# Patient Record
Sex: Male | Born: 1970 | Race: Black or African American | Hispanic: No | Marital: Married | Smoking: Never smoker
Health system: Southern US, Community
[De-identification: ages and names within clinical notes are randomized; demographics above are authoritative.]

## PROBLEM LIST (undated history)

## (undated) DIAGNOSIS — I10 Essential (primary) hypertension: Secondary | ICD-10-CM

## (undated) DIAGNOSIS — I6529 Occlusion and stenosis of unspecified carotid artery: Secondary | ICD-10-CM

## (undated) HISTORY — PX: NO PAST SURGERIES: SHX2092

## (undated) HISTORY — DX: Occlusion and stenosis of unspecified carotid artery: I65.29

---

## 2019-03-20 DIAGNOSIS — L988 Other specified disorders of the skin and subcutaneous tissue: Secondary | ICD-10-CM | POA: Diagnosis not present

## 2019-03-23 DIAGNOSIS — L280 Lichen simplex chronicus: Secondary | ICD-10-CM | POA: Diagnosis not present

## 2019-03-27 DIAGNOSIS — L280 Lichen simplex chronicus: Secondary | ICD-10-CM | POA: Diagnosis not present

## 2019-06-23 DIAGNOSIS — I10 Essential (primary) hypertension: Secondary | ICD-10-CM | POA: Diagnosis not present

## 2019-06-23 DIAGNOSIS — E669 Obesity, unspecified: Secondary | ICD-10-CM | POA: Diagnosis not present

## 2019-07-06 DIAGNOSIS — I10 Essential (primary) hypertension: Secondary | ICD-10-CM | POA: Diagnosis not present

## 2019-07-06 DIAGNOSIS — Z23 Encounter for immunization: Secondary | ICD-10-CM | POA: Diagnosis not present

## 2019-08-22 DIAGNOSIS — I10 Essential (primary) hypertension: Secondary | ICD-10-CM | POA: Diagnosis not present

## 2021-10-02 ENCOUNTER — Observation Stay (HOSPITAL_COMMUNITY)
Admission: EM | Admit: 2021-10-02 | Discharge: 2021-10-03 | Disposition: A | Discharge status: Home / Self Care | Payer: 59 | Attending: Family Medicine | Admitting: Family Medicine

## 2021-10-02 ENCOUNTER — Emergency Department (HOSPITAL_COMMUNITY): Payer: 59

## 2021-10-02 DIAGNOSIS — Z79899 Other long term (current) drug therapy: Secondary | ICD-10-CM | POA: Diagnosis not present

## 2021-10-02 DIAGNOSIS — I10 Essential (primary) hypertension: Secondary | ICD-10-CM | POA: Diagnosis not present

## 2021-10-02 DIAGNOSIS — Z20822 Contact with and (suspected) exposure to covid-19: Secondary | ICD-10-CM | POA: Diagnosis not present

## 2021-10-02 DIAGNOSIS — R55 Syncope and collapse: Principal | ICD-10-CM | POA: Diagnosis present

## 2021-10-02 DIAGNOSIS — N179 Acute kidney failure, unspecified: Secondary | ICD-10-CM | POA: Insufficient documentation

## 2021-10-02 DIAGNOSIS — R7989 Other specified abnormal findings of blood chemistry: Secondary | ICD-10-CM | POA: Diagnosis not present

## 2021-10-02 DIAGNOSIS — R778 Other specified abnormalities of plasma proteins: Secondary | ICD-10-CM | POA: Diagnosis present

## 2021-10-02 HISTORY — DX: Essential (primary) hypertension: I10

## 2021-10-02 LAB — CBC WITH DIFFERENTIAL/PLATELET
Abs Immature Granulocytes: 0.02 10*3/uL (ref 0.00–0.07)
Basophils Absolute: 0 10*3/uL (ref 0.0–0.1)
Basophils Relative: 0 %
Eosinophils Absolute: 0.1 10*3/uL (ref 0.0–0.5)
Eosinophils Relative: 1 %
HCT: 41.6 % (ref 39.0–52.0)
Hemoglobin: 13.1 g/dL (ref 13.0–17.0)
Immature Granulocytes: 0 %
Lymphocytes Relative: 21 %
Lymphs Abs: 1.5 10*3/uL (ref 0.7–4.0)
MCH: 25.5 pg — ABNORMAL LOW (ref 26.0–34.0)
MCHC: 31.5 g/dL (ref 30.0–36.0)
MCV: 80.9 fL (ref 80.0–100.0)
Monocytes Absolute: 0.5 10*3/uL (ref 0.1–1.0)
Monocytes Relative: 6 %
Neutro Abs: 5.3 10*3/uL (ref 1.7–7.7)
Neutrophils Relative %: 72 %
Platelets: 238 10*3/uL (ref 150–400)
RBC: 5.14 MIL/uL (ref 4.22–5.81)
RDW: 13 % (ref 11.5–15.5)
WBC: 7.4 10*3/uL (ref 4.0–10.5)
nRBC: 0 % (ref 0.0–0.2)

## 2021-10-02 LAB — COMPREHENSIVE METABOLIC PANEL
ALT: 25 U/L (ref 0–44)
AST: 22 U/L (ref 15–41)
Albumin: 3.9 g/dL (ref 3.5–5.0)
Alkaline Phosphatase: 37 U/L — ABNORMAL LOW (ref 38–126)
Anion gap: 7 (ref 5–15)
BUN: 11 mg/dL (ref 6–20)
CO2: 26 mmol/L (ref 22–32)
Calcium: 8.8 mg/dL — ABNORMAL LOW (ref 8.9–10.3)
Chloride: 105 mmol/L (ref 98–111)
Creatinine, Ser: 1.58 mg/dL — ABNORMAL HIGH (ref 0.61–1.24)
GFR, Estimated: 53 mL/min — ABNORMAL LOW (ref >60)
Glucose, Bld: 114 mg/dL — ABNORMAL HIGH (ref 70–99)
Potassium: 3.5 mmol/L (ref 3.5–5.1)
Sodium: 138 mmol/L (ref 135–145)
Total Bilirubin: 0.9 mg/dL (ref 0.3–1.2)
Total Protein: 6.4 g/dL — ABNORMAL LOW (ref 6.5–8.1)

## 2021-10-02 LAB — CK: Total CK: 207 U/L (ref 49–397)

## 2021-10-02 LAB — TROPONIN I (HIGH SENSITIVITY): Troponin I (High Sensitivity): 12 ng/L (ref <18)

## 2021-10-02 MED ORDER — LACTATED RINGERS IV BOLUS
1000.0000 mL | Freq: Once | INTRAVENOUS | Status: AC
Start: 2021-10-02 — End: 2021-10-02
  Administered 2021-10-02: 1000 mL via INTRAVENOUS

## 2021-10-02 NOTE — ED Triage Notes (Signed)
Passed out at boxing class (about 5sec) and in EMS truck (about 10 sec); elevation on EKG; hx HTN; no CP; no cardiac hx

## 2021-10-02 NOTE — Discharge Instructions (Addendum)
Call the number listed in your discharge paperwork to schedule an appointment with a cardiologist. Avoid strenuous activity or significant exertion until you are able to follow up with the cardiologist. Do not hesitate to return to the emergency department if you experience any chest pain, shortness of breath, or additional fainting spells.

## 2021-10-02 NOTE — ED Provider Notes (Signed)
[facility]   CSN: [id number] Arrival date & time: 10/02/21  1937     History  Chief Complaint  Patient presents with   Loss of Consciousness    Passed out at boxing class and in EMS truck; elevation on EKG    Miguel BadeJamie T Campillo is a 51 y.o. male with past medical history significant for hypertension who presents after a syncopal episode.  Patient was attending his first kickboxing class when he began to feel lightheaded and thirsty.  As he was going to get something to drink, he had a syncopal episode.  He denies chest pain or shortness of breath prior to syncopal episode.  He was able to slumped down into a chair before passing out and did not fall or hit his head.  Afterwards when he was talking to EMS he reportedly had another brief syncopal episode lasting less than 10 seconds.  He was then brought to the emergency department for further evaluation.  He currently feels like his normal self and is asymptomatic.  He has never had anything like this happen before.  He denies any recent infectious symptoms such as fevers, chills, cough, congestion, sore throat, nausea, vomiting, changes to bowel movements or urinary symptoms.    Home Medications Prior to Admission medications   Medication Sig Start Date End Date Taking? Authorizing Provider  amLODipine (NORVASC) 10 MG tablet Take 10 mg by mouth daily. 08/20/21  Yes [provider]      Allergies    Shellfish allergy    Review of Systems   Review of Systems  Constitutional:  Negative for chills and fever.  HENT:  Negative for congestion and sore throat.   Respiratory:  Negative for cough, chest tightness and shortness of breath.   Cardiovascular:  Negative for chest pain and leg swelling.  Gastrointestinal:  Negative for abdominal pain, diarrhea, nausea and vomiting.  Genitourinary:  Negative for dysuria and frequency.  Musculoskeletal:  Negative for back pain and neck  pain.  Skin:  Negative for wound.  Neurological:  Positive for syncope and light-headedness. Negative for dizziness, seizures, weakness and headaches.   Physical Exam Updated Vital Signs BP 136/90    Pulse 100    Temp 98 F (36.7 C) (Oral)    Resp 18    Ht 5\' 10"  (1.778 m)    Wt 102.1 kg    SpO2 99%    BMI 32.28 kg/m  Physical Exam Vitals and nursing note reviewed.  Constitutional:      General: He is not in acute distress.    Appearance: He is well-developed. He is obese.  HENT:     Head: Normocephalic and atraumatic.     Right Ear: External ear normal.     Left Ear: External ear normal.     Nose: Nose normal.     Mouth/Throat:     Mouth: Mucous membranes are moist.     Pharynx: Oropharynx is clear.  Eyes:     Extraocular Movements: Extraocular movements intact.     Conjunctiva/sclera: Conjunctivae normal.     Pupils: Pupils are equal, round, and reactive to light.  Cardiovascular:     Rate and Rhythm: Normal rate and regular rhythm.     Pulses: Normal pulses.     Heart sounds: Normal heart sounds. No murmur heard. Pulmonary:     Effort: Pulmonary effort is normal. No respiratory distress.     Breath sounds: Normal breath sounds.  Abdominal:  Palpations: Abdomen is soft.     Tenderness: There is no abdominal tenderness.  Musculoskeletal:        General: No swelling.     Cervical back: Neck supple.     Right lower leg: No edema.     Left lower leg: No edema.  Skin:    General: Skin is warm and dry.     Capillary Refill: Capillary refill takes less than 2 seconds.  Neurological:     General: No focal deficit present.     Mental Status: He is alert and oriented to person, place, and time.  Psychiatric:        Mood and Affect: Mood normal.    ED Results / Procedures / Treatments   Labs (all labs ordered are listed, but only abnormal results are displayed) Labs Reviewed  CBC WITH DIFFERENTIAL/PLATELET - Abnormal; Notable for the following components:      Result  Value   MCH 25.5 (*)    All other components within normal limits  COMPREHENSIVE METABOLIC PANEL - Abnormal; Notable for the following components:   Glucose, Bld 114 (*)    Creatinine, Ser 1.58 (*)    Calcium 8.8 (*)    Total Protein 6.4 (*)    Alkaline Phosphatase 37 (*)    GFR, Estimated 53 (*)    All other components within normal limits  TROPONIN I (HIGH SENSITIVITY) - Abnormal; Notable for the following components:   Troponin I (High Sensitivity) 23 (*)    All other components within normal limits  CK  TROPONIN I (HIGH SENSITIVITY)    EKG EKG Interpretation  Date/Time:  Thursday October 02 2021 19:45:15 EST Ventricular Rate:  97 PR Interval:  170 QRS Duration: 87 QT Interval:  335 QTC Calculation: 426 R Axis:   48 Text Interpretation: Sinus rhythm Borderline repolarization abnormality T wave abnormality Abnormal ECG Confirmed by Gerhard Munch [phone]) on 10/02/2021 8:02:12 PM  Radiology DG Chest Portable 1 View  Result Date: 10/02/2021 CLINICAL DATA:  Syncope EXAM: PORTABLE CHEST 1 VIEW COMPARISON:  None. FINDINGS: The heart size and mediastinal contours are within normal limits. Both lungs are clear. The visualized skeletal structures are unremarkable. IMPRESSION: No active disease. Electronically Signed   By: Jasmine Pang M.D.   On: 10/02/2021 20:25    Procedures Procedures   Medications Ordered in ED Medications  lactated ringers bolus 1,000 mL (0 mLs Intravenous Stopped 10/02/21 2211)    ED Course/ Medical Decision Making/ A&P                           Patient presents after syncopal episode as described in HPI above.  On initial evaluation, the patient is afebrile, hemodynamically stable, and saturating well on room air no acute distress.  He has no complaints at this time and states that he feels like his normal self.  Will obtain CBC, CMP, troponin x2, CXR, and EKG to further evaluate syncopal episode.  LR bolus ordered for likely dehydration in the setting  of a syncopal episode.  EKG reviewed by myself shows normal sinus rhythm with T wave inversion in lead III.  There is borderline repolarization abnormality in V2.  No significant ST elevations or depressions.  No evidence of arrhythmia.  Intervals and axis are normal.  Labs and imaging reviewed by myself.  CXR with no acute cardiopulmonary abnormality.  CBC is unremarkable.  CMP with elevated creatinine to 1.58.  I have no prior for  comparison.  Patient was empirically given a liter bolus of LR for dehydration.  Initial troponin is normal.  Delta troponin mildly elevated at 23.  Given elevated troponin and unclear etiology of syncope, patient is medium risk based on [place] syncope risk score.  I believe it would be appropriate to bring the patient into the hospital for echocardiogram and to continue trending troponins.  The patient continues to deny all symptoms including chest pain.  I discussed the patient with the hospitalist who will admit the patient.   Final Clinical Impression(s) / ED Diagnoses Final diagnoses:  Syncope, unspecified syncope type    Rx / DC Orders ED Discharge Orders     None         Ronald Vinsant, Davy Pique, MD 10/03/21 0050    Gerhard Munch, MD 10/08/21 [phone]

## 2021-10-02 NOTE — ED Notes (Signed)
Pt ambulated to bathroom with no noted difficulties.

## 2021-10-03 ENCOUNTER — Observation Stay (HOSPITAL_BASED_OUTPATIENT_CLINIC_OR_DEPARTMENT_OTHER): Payer: 59

## 2021-10-03 ENCOUNTER — Encounter (HOSPITAL_COMMUNITY): Payer: Self-pay

## 2021-10-03 ENCOUNTER — Other Ambulatory Visit: Payer: Self-pay

## 2021-10-03 DIAGNOSIS — I10 Essential (primary) hypertension: Secondary | ICD-10-CM | POA: Diagnosis not present

## 2021-10-03 DIAGNOSIS — N179 Acute kidney failure, unspecified: Secondary | ICD-10-CM | POA: Diagnosis not present

## 2021-10-03 DIAGNOSIS — R55 Syncope and collapse: Secondary | ICD-10-CM

## 2021-10-03 DIAGNOSIS — R778 Other specified abnormalities of plasma proteins: Secondary | ICD-10-CM | POA: Diagnosis not present

## 2021-10-03 LAB — CBC
HCT: 40.7 % (ref 39.0–52.0)
Hemoglobin: 12.7 g/dL — ABNORMAL LOW (ref 13.0–17.0)
MCH: 25.2 pg — ABNORMAL LOW (ref 26.0–34.0)
MCHC: 31.2 g/dL (ref 30.0–36.0)
MCV: 80.9 fL (ref 80.0–100.0)
Platelets: 237 10*3/uL (ref 150–400)
RBC: 5.03 MIL/uL (ref 4.22–5.81)
RDW: 13.3 % (ref 11.5–15.5)
WBC: 8.7 10*3/uL (ref 4.0–10.5)
nRBC: 0 % (ref 0.0–0.2)

## 2021-10-03 LAB — COMPREHENSIVE METABOLIC PANEL
ALT: 26 U/L (ref 0–44)
AST: 22 U/L (ref 15–41)
Albumin: 3.6 g/dL (ref 3.5–5.0)
Alkaline Phosphatase: 36 U/L — ABNORMAL LOW (ref 38–126)
Anion gap: 6 (ref 5–15)
BUN: 10 mg/dL (ref 6–20)
CO2: 23 mmol/L (ref 22–32)
Calcium: 8.5 mg/dL — ABNORMAL LOW (ref 8.9–10.3)
Chloride: 110 mmol/L (ref 98–111)
Creatinine, Ser: 1.25 mg/dL — ABNORMAL HIGH (ref 0.61–1.24)
GFR, Estimated: >60 mL/min (ref >60)
Glucose, Bld: 97 mg/dL (ref 70–99)
Potassium: 3.9 mmol/L (ref 3.5–5.1)
Sodium: 139 mmol/L (ref 135–145)
Total Bilirubin: 0.8 mg/dL (ref 0.3–1.2)
Total Protein: 6 g/dL — ABNORMAL LOW (ref 6.5–8.1)

## 2021-10-03 LAB — RESP PANEL BY RT-PCR (FLU A&B, COVID) ARPGX2
Influenza A by PCR: NEGATIVE
Influenza B by PCR: NEGATIVE
SARS Coronavirus 2 by RT PCR: NEGATIVE

## 2021-10-03 LAB — ECHOCARDIOGRAM COMPLETE
AR max vel: 2.43 cm2
AV Peak grad: 11.3 mmHg
Ao pk vel: 1.68 m/s
Area-P 1/2: 5.79 cm2
Calc EF: 58 %
Height: 70 in
S' Lateral: 2.8 cm
Single Plane A2C EF: 59.7 %
Single Plane A4C EF: 58.2 %
Weight: 3600 oz

## 2021-10-03 LAB — TROPONIN I (HIGH SENSITIVITY)
Troponin I (High Sensitivity): 17 ng/L (ref <18)
Troponin I (High Sensitivity): 23 ng/L — ABNORMAL HIGH (ref <18)

## 2021-10-03 LAB — URINALYSIS, COMPLETE (UACMP) WITH MICROSCOPIC
Bilirubin Urine: NEGATIVE
Glucose, UA: NEGATIVE mg/dL
Hgb urine dipstick: NEGATIVE
Ketones, ur: NEGATIVE mg/dL
Leukocytes,Ua: NEGATIVE
Nitrite: NEGATIVE
Protein, ur: NEGATIVE mg/dL
Specific Gravity, Urine: 1.015 (ref 1.005–1.030)
pH: 6.5 (ref 5.0–8.0)

## 2021-10-03 LAB — SODIUM, URINE, RANDOM: Sodium, Ur: 147 mmol/L

## 2021-10-03 LAB — CREATININE, URINE, RANDOM: Creatinine, Urine: 171.24 mg/dL

## 2021-10-03 LAB — HIV ANTIBODY (ROUTINE TESTING W REFLEX): HIV Screen 4th Generation wRfx: NONREACTIVE

## 2021-10-03 LAB — MAGNESIUM: Magnesium: 2.2 mg/dL (ref 1.7–2.4)

## 2021-10-03 MED ORDER — SODIUM CHLORIDE 0.9 % IV SOLN: INTRAVENOUS | Status: DC

## 2021-10-03 MED ORDER — ACETAMINOPHEN 650 MG RE SUPP: 650.0000 mg | Freq: Four times a day (QID) | RECTAL | Status: DC | PRN

## 2021-10-03 MED ORDER — ACETAMINOPHEN 325 MG PO TABS: 650.0000 mg | ORAL_TABLET | Freq: Four times a day (QID) | ORAL | Status: DC | PRN

## 2021-10-03 MED ORDER — LACTATED RINGERS IV SOLN: INTRAVENOUS | Status: DC

## 2021-10-03 NOTE — Discharge Summary (Signed)
PatientPhysician Discharge Summary  DEMETRIS WHITESELL [id number] DOB: 23-Sep-1970 DOA: 10/02/2021  PCP: Default, Provider, MD  Admit date: 10/02/2021 Discharge date: 10/03/2021    Admitted From: Home Disposition: Home  Recommendations for Outpatient Follow-up:  Follow up with PCP in 1-2 weeks Please obtain BMP/CBC in one week Please follow up with your PCP on the following pending results: Unresulted Labs (From admission, onward)    None         Home Health: None Equipment/Devices: None  Discharge Condition: Stable CODE STATUS: Full code Diet recommendation: Cardiac  Subjective: Seen and examined.  He has no complaints.  Fully alert and oriented.  No focal deficit.  No further episodes of syncope since admission.  I have copied following HPI and ED course from my colleague admitting hospitalist Dr. Nadara Mustard towards H&P for details the interested readers. HPI: JALONTAE BOLAN is a 51 y.o. male with medical history significant for essential hypertension who is admitted to [facility] on 10/02/2021 with syncope after presenting from home to [facility] ED complaining of 2 episodes of loss of consciousness.    The patient presents with complaint of 2 episodes of loss of consciousness over the last day.  The for such episode occurred while he was at a kickboxing class earlier on 10/02/2021.  He reports that he had a significant thirst and was actively ambulating to the water fountain to get a drink, when he developed dizziness, lightheadedness, and the subjective sensation of impending loss of consciousness.  In light of these preceding symptoms, he was able to slowly lower himself to a surrounding chair before completely losing consciousness.  As a consequence, there was no associated trauma, loss of consciousness, or hitting of his head.  This was a witnessed episode, with observers subsequently conveying that the loss of consciousness lasted for less than 10 seconds and was not associated  with any tonic-clonic activity.  Additionally, this episode was not associate with any loss of bowel/bladder function, nor any tongue biting.     EMS was subsequently contacted, and the patient was brought to [facility] emergency department for further evaluation thereof.  In route to ED, the patient experienced a second episode of loss of consciousness, associated with very similar preceding symptoms of dizziness, lightheadedness, and subjective sensation of impending consciousness, prior to overt loss of consciousness, as witnessed by EMS personnel, confirming that the duration of this loss consciousness lasted for less than 10 seconds, without any ensuing confusion upon regaining consciousness.   As a relates to both these episodes of syncope, the patient denies any recent, immediately preceding, or ensuing chest pain, shortness of breath, palpitations, nausea, vomiting,.  He also denies any prior history of syncopal event.   The patient denies any associated acute focal weakness, acute focal numbness, paresthesias, facial droop, slurred speech, expressive aphasia, acute change in vision, dysphagia, vertigo.  Denies any associated or ensuing headache or neck pain. Denies any additional resultant acute arthralgias or myalgias.    Denies any recent subjective fever, chills, rigors, or generalized myalgias.  No recent neck stiffness, rhinitis, rhinorrhea, sore throat, wheezing, cough, abdominal pain, or rash.  No recent traveling or known COVID-19 exposures. No recent worsening of peripheral edema, nor any calf tenderness, or new lower extremity erythema.  Denies any recent hemoptysis.  Not associated with any recent dysuria, gross hematuria, or change in urinary urgency/frequency.   Medical history notable for essential hypertension, for which the patient is on amlodipine.  No prior echocardiogram on file.  ED Course:  Vital signs in the ED were notable for the following:  Afebrile; heart  rate 94-1 02; blood pressure 129/83-140/88; respiratory rate 16-21, oxygen saturation 99 to 100% on room air.   Labs were notable for the following: CMP notable for the following: Sodium 138, potassium 3.5, creatinine 1.58, without any prior serum creatinine data points available for point comparison of glucose 114, and liver enzymes around his MRI limits.  High-sensitivity troponin initially noted to be 12 with repeat value trending slightly 23, without any prior troponin value available for point comparison.  CBC notable for white blood cell count 7400, he 13.  COVID-19/influenza PCR were checked in the ED today, with results currently pending.   Imaging and additional notable ED work-up: EKG showed sinus rhythm with heart rate 97, normal intervals, nonspecific T wave inversion in lead III, as well as nonspecific less than 1 mm ST elevation limited to V2, without any prior EKG available for point of comparison.  Chest x-ray showed no evidence of acute cardiopulmonary process, including no evidence of infiltrate, edema, effusion, or pneumothorax.   While in the ED, the following were administered: LR x1 L bolus.  Subsequently, the patient was admitted for overnight observation for further evaluation and management of presenting syncope.    Brief/Interim Summary: Briefly, he was admitted with syncope.  EKG with no acute ST-T wave changes or any arrhythmia.  First troponin negative, second slightly limited and third negative.  Transthoracic echo with normal ejection fraction, no wall motion abnormality no right heart failure.  Tricuspid aortic valve with no stenosis or regurgitation.  No other valvular defect either.  Patient takes amlodipine which have been held and despite of that, patient's blood pressure is either normal or low normal and the fact that patient also came in with AKI which is improving with IV fluids indicates that patient's syncope is likely combination of orthostatic hypotension,  dehydration and overtreatment of his blood pressure.  Based on the data, I am discontinuing his amlodipine at discharge and have recommended him to keep himself hydrated.  Check your blood pressure regularly and take the recorded data to your PCPs visit next week for them to reconsider starting antihypertensives if needed.  Patient tells me that he works out on a regular basis.  Unfortunately, I do not have information available whether his blood pressure was checked at the scene by EMS or not.  Orthostatics were ordered yesterday here but they were not checked and patient has received significant amount of IV fluids and orthostatics will be skewed at this point in time.  Discharge plan was discussed with patient and/or family member and they verbalized understanding and agreed with it.  Discharge Diagnoses:  Principal Problem:   Syncope Active Problems:   AKI (acute kidney injury) ([place])   Elevated troponin   HTN (hypertension)    Discharge Instructions   Allergies as of 10/03/2021       Reactions   Shellfish Allergy Anaphylaxis        Medication List     STOP taking these medications    amLODipine 10 MG tablet Commonly known as: NORVASC        Allergies  Allergen Reactions   Shellfish Allergy Anaphylaxis    Consultations: None   Procedures/Studies: DG Chest Portable 1 View  Result Date: 10/02/2021 CLINICAL DATA:  Syncope EXAM: PORTABLE CHEST 1 VIEW COMPARISON:  None. FINDINGS: The heart size and mediastinal contours are within normal limits. Both lungs are clear. The visualized  skeletal structures are unremarkable. IMPRESSION: No active disease. Electronically Signed   By: Donavan Foil M.D.   On: 10/02/2021 20:25   ECHOCARDIOGRAM COMPLETE  Result Date: 10/03/2021    ECHOCARDIOGRAM REPORT   Patient Name:   MCCAIN BUCHBERGER Date of Exam: 10/03/2021 Medical Rec #:  [id number]       Height:       70.0 in Accession #:    [id number]      Weight:       225.0 lb Date of  Birth:  1970/10/10        BSA:          2.194 m Patient Age:    51 years        BP:           116/74 mmHg Patient Gender: M               HR:           87 bpm. Exam Location:  Inpatient Procedure: 2D Echo, Color Doppler and Cardiac Doppler Indications:    Syncope  History:        Patient has no prior history of Echocardiogram examinations.                 Risk Factors:Hypertension.  Sonographer:    Jyl Heinz Referring Phys: [id number] [place]  1. Left ventricular ejection fraction, by estimation, is 60 to 65%. The left ventricle has normal function. The left ventricle has no regional wall motion abnormalities. Left ventricular diastolic parameters were normal.  2. Right ventricular systolic function is normal. The right ventricular size is normal. Tricuspid regurgitation signal is inadequate for assessing PA pressure.  3. Left atrial size was mildly dilated.  4. The mitral valve is normal in structure. Mild mitral valve regurgitation.  5. The aortic valve is tricuspid. There is mild calcification of the aortic valve. There is mild thickening of the aortic valve. Aortic valve regurgitation is not visualized. Aortic valve sclerosis is present, with no evidence of aortic valve stenosis.  6. The inferior vena cava is normal in size with greater than 50% respiratory variability, suggesting right atrial pressure of 3 mmHg. Comparison(s): No prior Echocardiogram. FINDINGS  Left Ventricle: Left ventricular ejection fraction, by estimation, is 60 to 65%. The left ventricle has normal function. The left ventricle has no regional wall motion abnormalities. The left ventricular internal cavity size was normal in size. There is  no left ventricular hypertrophy. Left ventricular diastolic parameters were normal. Right Ventricle: The right ventricular size is normal. No increase in right ventricular wall thickness. Right ventricular systolic function is normal. Tricuspid regurgitation signal is inadequate for  assessing PA pressure. Left Atrium: Left atrial size was mildly dilated. Right Atrium: Right atrial size was normal in size. Pericardium: There is no evidence of pericardial effusion. Mitral Valve: The mitral valve is normal in structure. Mild mitral valve regurgitation. Tricuspid Valve: The tricuspid valve is normal in structure. Tricuspid valve regurgitation is trivial. Aortic Valve: The aortic valve is tricuspid. There is mild calcification of the aortic valve. There is mild thickening of the aortic valve. Aortic valve regurgitation is not visualized. Aortic valve sclerosis is present, with no evidence of aortic valve stenosis. Aortic valve peak gradient measures 11.3 mmHg. Pulmonic Valve: The pulmonic valve was normal in structure. Pulmonic valve regurgitation is trivial. Aorta: The aortic root and ascending aorta are structurally normal, with no evidence of dilitation. Venous: The inferior vena cava is normal  in size with greater than 50% respiratory variability, suggesting right atrial pressure of 3 mmHg. IAS/Shunts: The atrial septum is grossly normal.  LEFT VENTRICLE PLAX 2D LVIDd:         4.40 cm      Diastology LVIDs:         2.80 cm      LV e' medial:    8.05 cm/s LV PW:         1.20 cm      LV E/e' medial:  12.0 LV IVS:        1.00 cm      LV e' lateral:   9.57 cm/s LVOT diam:     2.00 cm      LV E/e' lateral: 10.1 LV SV:         84 LV SV Index:   38 LVOT Area:     3.14 cm  LV Volumes (MOD) LV vol d, MOD A2C: 125.0 ml LV vol d, MOD A4C: 131.0 ml LV vol s, MOD A2C: 50.4 ml LV vol s, MOD A4C: 54.8 ml LV SV MOD A2C:     74.6 ml LV SV MOD A4C:     131.0 ml LV SV MOD BP:      74.2 ml RIGHT VENTRICLE             IVC RV Basal diam:  3.10 cm     IVC diam: 1.30 cm RV Mid diam:    2.40 cm RV S prime:     12.90 cm/s TAPSE (M-mode): 2.5 cm LEFT ATRIUM             Index        RIGHT ATRIUM           Index LA diam:        3.30 cm 1.50 cm/m   RA Area:     12.60 cm LA Vol (A2C):   47.3 ml 21.55 ml/m  RA Volume:    27.10 ml  12.35 ml/m LA Vol (A4C):   76.1 ml 34.68 ml/m LA Biplane Vol: 62.1 ml 28.30 ml/m  AORTIC VALVE AV Area (Vmax): 2.43 cm AV Vmax:        168.00 cm/s AV Peak Grad:   11.3 mmHg LVOT Vmax:      130.00 cm/s LVOT Vmean:     94.100 cm/s LVOT VTI:       0.267 m  AORTA Ao Root diam: 2.70 cm Ao Asc diam:  2.80 cm MITRAL VALVE MV Area (PHT): 5.79 cm    SHUNTS MV Decel Time: 131 msec    Systemic VTI:  0.27 m MV E velocity: 96.70 cm/s  Systemic Diam: 2.00 cm MV A velocity: 87.30 cm/s MV E/A ratio:  1.11 Gwyndolyn Kaufman MD Electronically signed by Gwyndolyn Kaufman MD Signature Date/Time: 10/03/2021/10:49:48 AM    Final      Discharge Exam: Vitals:   10/03/21 0630 10/03/21 0730  BP: 116/74 122/79  Pulse: 84 87  Resp: 16 16  Temp:  98 F (36.7 C)  SpO2: 97% 98%   Vitals:   10/03/21 0600 10/03/21 0615 10/03/21 0630 10/03/21 0730  BP: 127/82 119/62 116/74 122/79  Pulse: 88 86 84 87  Resp: 17 16 16 16   Temp:    98 F (36.7 C)  TempSrc:    Oral  SpO2: 99% 98% 97% 98%  Weight:      Height:        General: Pt is alert, awake, not in  acute distress Cardiovascular: RRR, S1/S2 +, no rubs, no gallops Respiratory: CTA bilaterally, no wheezing, no rhonchi Abdominal: Soft, NT, ND, bowel sounds + Extremities: no edema, no cyanosis    The results of significant diagnostics from this hospitalization (including imaging, microbiology, ancillary and laboratory) are listed below for reference.     Microbiology: Recent Results (from the past 240 hour(s))  Resp Panel by RT-PCR (Flu A&B, Covid) Nasopharyngeal Swab     Status: None   Collection Time: 10/03/21 12:52 AM   Specimen: Nasopharyngeal Swab; Nasopharyngeal(NP) swabs in vial transport medium  Result Value Ref Range Status   SARS Coronavirus 2 by RT PCR NEGATIVE NEGATIVE Final    Comment: (NOTE) SARS-CoV-2 target nucleic acids are NOT DETECTED.  The SARS-CoV-2 RNA is generally detectable in upper respiratory specimens during the acute  phase of infection. The lowest concentration of SARS-CoV-2 viral copies this assay can detect is 138 copies/mL. A negative result does not preclude SARS-Cov-2 infection and should not be used as the sole basis for treatment or other patient management decisions. A negative result may occur with  improper specimen collection/handling, submission of specimen other than nasopharyngeal swab, presence of viral mutation(s) within the areas targeted by this assay, and inadequate number of viral copies(<138 copies/mL). A negative result must be combined with clinical observations, patient history, and epidemiological information. The expected result is Negative.  Fact Sheet for Patients:  EntrepreneurPulse.com.au  Fact Sheet for Healthcare Providers:  IncredibleEmployment.be  This test is no t yet approved or cleared by the [place] FDA and  has been authorized for detection and/or diagnosis of SARS-CoV-2 by FDA under an Emergency Use Authorization (EUA). This EUA will remain  in effect (meaning this test can be used) for the duration of the COVID-19 declaration under Section 564(b)(1) of the Act, 21 U.S.C.section 360bbb-3(b)(1), unless the authorization is terminated  or revoked sooner.       Influenza A by PCR NEGATIVE NEGATIVE Final   Influenza B by PCR NEGATIVE NEGATIVE Final    Comment: (NOTE) The Xpert Xpress SARS-CoV-2/FLU/RSV plus assay is intended as an aid in the diagnosis of influenza from Nasopharyngeal swab specimens and should not be used as a sole basis for treatment. Nasal washings and aspirates are unacceptable for Xpert Xpress SARS-CoV-2/FLU/RSV testing.  Fact Sheet for Patients: EntrepreneurPulse.com.au  Fact Sheet for Healthcare Providers: IncredibleEmployment.be  This test is not yet approved or cleared by the [place] FDA and has been authorized for detection and/or diagnosis of  SARS-CoV-2 by FDA under an Emergency Use Authorization (EUA). This EUA will remain in effect (meaning this test can be used) for the duration of the COVID-19 declaration under Section 564(b)(1) of the Act, 21 U.S.C. section 360bbb-3(b)(1), unless the authorization is terminated or revoked.  Performed at [facility], [place] [address] [place], [place] [address]      Labs: BNP (last 3 results) No results for input(s): BNP in the last 8760 hours. Basic Metabolic Panel: Recent Labs  Lab 10/02/21 2010 10/03/21 0342  NA 138 139  K 3.5 3.9  CL 105 110  CO2 26 23  GLUCOSE 114* 97  BUN 11 10  CREATININE 1.58* 1.25*  CALCIUM 8.8* 8.5*  MG  --  2.2   Liver Function Tests: Recent Labs  Lab 10/02/21 2010 10/03/21 0342  AST 22 22  ALT 25 26  ALKPHOS 37* 36*  BILITOT 0.9 0.8  PROT 6.4* 6.0*  ALBUMIN 3.9 3.6   No results for input(s):  LIPASE, AMYLASE in the last 168 hours. No results for input(s): AMMONIA in the last 168 hours. CBC: Recent Labs  Lab 10/02/21 2010 10/03/21 0342  WBC 7.4 8.7  NEUTROABS 5.3  --   HGB 13.1 12.7*  HCT 41.6 40.7  MCV 80.9 80.9  PLT 238 237   Cardiac Enzymes: Recent Labs  Lab 10/02/21 2010  CKTOTAL 207   BNP: Invalid input(s): POCBNP CBG: No results for input(s): GLUCAP in the last 168 hours. D-Dimer No results for input(s): DDIMER in the last 72 hours. Hgb A1c No results for input(s): HGBA1C in the last 72 hours. Lipid Profile No results for input(s): CHOL, HDL, LDLCALC, TRIG, CHOLHDL, LDLDIRECT in the last 72 hours. Thyroid function studies No results for input(s): TSH, T4TOTAL, T3FREE, THYROIDAB in the last 72 hours.  Invalid input(s): FREET3 Anemia work up No results for input(s): VITAMINB12, FOLATE, FERRITIN, TIBC, IRON, RETICCTPCT in the last 72 hours. Urinalysis    Component Value Date/Time   COLORURINE YELLOW 10/03/2021 0354   APPEARANCEUR CLEAR 10/03/2021 0354   LABSPEC 1.015 10/03/2021 0354   PHURINE 6.5  10/03/2021 0354   GLUCOSEU NEGATIVE 10/03/2021 0354   HGBUR NEGATIVE 10/03/2021 0354   BILIRUBINUR NEGATIVE 10/03/2021 0354   KETONESUR NEGATIVE 10/03/2021 0354   PROTEINUR NEGATIVE 10/03/2021 0354   NITRITE NEGATIVE 10/03/2021 0354   LEUKOCYTESUR NEGATIVE 10/03/2021 0354   Sepsis Labs Invalid input(s): PROCALCITONIN,  WBC,  LACTICIDVEN Microbiology Recent Results (from the past 240 hour(s))  Resp Panel by RT-PCR (Flu A&B, Covid) Nasopharyngeal Swab     Status: None   Collection Time: 10/03/21 12:52 AM   Specimen: Nasopharyngeal Swab; Nasopharyngeal(NP) swabs in vial transport medium  Result Value Ref Range Status   SARS Coronavirus 2 by RT PCR NEGATIVE NEGATIVE Final    Comment: (NOTE) SARS-CoV-2 target nucleic acids are NOT DETECTED.  The SARS-CoV-2 RNA is generally detectable in upper respiratory specimens during the acute phase of infection. The lowest concentration of SARS-CoV-2 viral copies this assay can detect is 138 copies/mL. A negative result does not preclude SARS-Cov-2 infection and should not be used as the sole basis for treatment or other patient management decisions. A negative result may occur with  improper specimen collection/handling, submission of specimen other than nasopharyngeal swab, presence of viral mutation(s) within the areas targeted by this assay, and inadequate number of viral copies(<138 copies/mL). A negative result must be combined with clinical observations, patient history, and epidemiological information. The expected result is Negative.  Fact Sheet for Patients:  BloggerCourse.com  Fact Sheet for Healthcare Providers:  SeriousBroker.it  This test is no t yet approved or cleared by the [place] FDA and  has been authorized for detection and/or diagnosis of SARS-CoV-2 by FDA under an Emergency Use Authorization (EUA). This EUA will remain  in effect (meaning this test can be used)  for the duration of the COVID-19 declaration under Section 564(b)(1) of the Act, 21 U.S.C.section 360bbb-3(b)(1), unless the authorization is terminated  or revoked sooner.       Influenza A by PCR NEGATIVE NEGATIVE Final   Influenza B by PCR NEGATIVE NEGATIVE Final    Comment: (NOTE) The Xpert Xpress SARS-CoV-2/FLU/RSV plus assay is intended as an aid in the diagnosis of influenza from Nasopharyngeal swab specimens and should not be used as a sole basis for treatment. Nasal washings and aspirates are unacceptable for Xpert Xpress SARS-CoV-2/FLU/RSV testing.  Fact Sheet for Patients: BloggerCourse.com  Fact Sheet for Healthcare Providers: SeriousBroker.it  This test is not  yet approved or cleared by the [place] and has been authorized for detection and/or diagnosis of SARS-CoV-2 by FDA under an Emergency Use Authorization (EUA). This EUA will remain in effect (meaning this test can be used) for the duration of the COVID-19 declaration under Section 564(b)(1) of the Act, 21 U.S.C. section 360bbb-3(b)(1), unless the authorization is terminated or revoked.  Performed at [facility], [place] [address] [place], [place] [address]      Time coordinating discharge: Over 30 minutes  SIGNED:   Darliss Cheney, MD  [facility] 10/03/2021, 12:24 PM  If 7PM-7AM, please contact night-coverage [email or web address]

## 2021-10-03 NOTE — ED Notes (Signed)
LR unvailable for infusion. Normal saline hung at 96ml/hr

## 2021-10-03 NOTE — H&P (Signed)
History and Physical    PLEASE NOTE THAT DRAGON DICTATION SOFTWARE WAS USED IN THE CONSTRUCTION OF THIS NOTE.   Miguel Marks [id number] DOB: 06/05/1971 DOA: 10/02/2021  PCP: Default, Provider, MD  Patient coming from: home   I have personally briefly reviewed patient's old medical records in [place]  Chief Complaint: loss of consciousness  HPI: Miguel Marks is a 51 y.o. male with medical history significant for essential hypertension who is admitted to [facility] on 10/02/2021 with syncope after presenting from home to [facility] ED complaining of 2 episodes of loss of consciousness.   The patient presents with complaint of 2 episodes of loss of consciousness over the last day.  The for such episode occurred while he was at a kickboxing class earlier on 10/02/2021.  He reports that he had a significant thirst and was actively ambulating to the water fountain to get a drink, when he developed dizziness, lightheadedness, and the subjective sensation of impending loss of consciousness.  In light of these preceding symptoms, he was able to slowly lower himself to a surrounding chair before completely losing consciousness.  As a consequence, there was no associated trauma, loss of consciousness, or hitting of his head.  This was a witnessed episode, with observers subsequently conveying that the loss of consciousness lasted for less than 10 seconds and was not associated with any tonic-clonic activity.  Additionally, this episode was not associate with any loss of bowel/bladder function, nor any tongue biting.    EMS was subsequently contacted, and the patient was brought to [facility] emergency department for further evaluation thereof.  In route to ED, the patient experienced a second episode of loss of consciousness, associated with very similar preceding symptoms of dizziness, lightheadedness, and subjective sensation of impending consciousness, prior to overt loss of consciousness,  as witnessed by EMS personnel, confirming that the duration of this loss consciousness lasted for less than 10 seconds, without any ensuing confusion upon regaining consciousness.  As a relates to both these episodes of syncope, the patient denies any recent, immediately preceding, or ensuing chest pain, shortness of breath, palpitations, nausea, vomiting,.  He also denies any prior history of syncopal event.  The patient denies any associated acute focal weakness, acute focal numbness, paresthesias, facial droop, slurred speech, expressive aphasia, acute change in vision, dysphagia, vertigo.  Denies any associated or ensuing headache or neck pain. Denies any additional resultant acute arthralgias or myalgias.   Denies any recent subjective fever, chills, rigors, or generalized myalgias.  No recent neck stiffness, rhinitis, rhinorrhea, sore throat, wheezing, cough, abdominal pain, or rash.  No recent traveling or known COVID-19 exposures. No recent worsening of peripheral edema, nor any calf tenderness, or new lower extremity erythema.  Denies any recent hemoptysis.  Not associated with any recent dysuria, gross hematuria, or change in urinary urgency/frequency.   Medical history notable for essential hypertension, for which the patient is on amlodipine.  No prior echocardiogram on file.        ED Course:  Vital signs in the ED were notable for the following:  Afebrile; heart rate 94-1 02; blood pressure 129/83-140/88; respiratory rate 16-21, oxygen saturation 99 to 100% on room air.  Labs were notable for the following: CMP notable for the following: Sodium 138, potassium 3.5, creatinine 1.58, without any prior serum creatinine data points available for point comparison of glucose 114, and liver enzymes around his MRI limits.  High-sensitivity troponin initially noted to be 12 with  repeat value trending slightly 23, without any prior troponin value available for point comparison.  CBC notable for  white blood cell count 7400, he 13.  COVID-19/influenza PCR were checked in the ED today, with results currently pending.  Imaging and additional notable ED work-up: EKG showed sinus rhythm with heart rate 97, normal intervals, nonspecific T wave inversion in lead III, as well as nonspecific less than 1 mm ST elevation limited to V2, without any prior EKG available for point of comparison.  Chest x-ray showed no evidence of acute cardiopulmonary process, including no evidence of infiltrate, edema, effusion, or pneumothorax.  While in the ED, the following were administered: LR x1 L bolus.  Subsequently, the patient was admitted for overnight observation for further evaluation and management of presenting syncope.   Review of Systems: As per HPI otherwise 10 point review of systems negative.   Past Medical History:  Diagnosis Date   HTN (hypertension)     History reviewed. No pertinent surgical history.  Social History:  reports that he has never smoked. He has never used smokeless tobacco. He reports that he does not currently use alcohol. He reports that he does not use drugs.   Allergies  Allergen Reactions   Shellfish Allergy Anaphylaxis    History reviewed. No pertinent family history.   Prior to Admission medications   Medication Sig Start Date End Date Taking? Authorizing Provider  amLODipine (NORVASC) 10 MG tablet Take 10 mg by mouth daily. 08/20/21  Yes [provider]     Objective    Physical Exam: Vitals:   10/02/21 2015 10/02/21 2030 10/02/21 2045 10/02/21 2300  BP: 137/87   136/90  Pulse: 95 99 94 100  Resp: 19 16 (!) 21 18  Temp:      TempSrc:      SpO2: 100% 99% 100% 99%  Weight:      Height:        General: appears to be stated age; alert, oriented Skin: warm, dry, no rash Head:  AT/[place] Mouth:  Oral mucosa membranes appear dry, normal dentition Neck: supple; trachea midline Heart:  RRR; did not appreciate any M/R/G Lungs: CTAB, did not  appreciate any wheezes, rales, or rhonchi Abdomen: + BS; soft, ND, NT Vascular: 2+ pedal pulses b/l; 2+ radial pulses b/l Extremities: no peripheral edema, no muscle wasting Neuro: strength and sensation intact in upper and lower extremities b/l   Labs on Admission: I have personally reviewed following labs and imaging studies  CBC: Recent Labs  Lab 10/02/21 2010  WBC 7.4  NEUTROABS 5.3  HGB 13.1  HCT 41.6  MCV 80.9  PLT 99991111   Basic Metabolic Panel: Recent Labs  Lab 10/02/21 2010  NA 138  K 3.5  CL 105  CO2 26  GLUCOSE 114*  BUN 11  CREATININE 1.58*  CALCIUM 8.8*   GFR: Estimated Creatinine Clearance: 66.2 mL/min (A) (by C-G formula based on SCr of 1.58 mg/dL (H)). Liver Function Tests: Recent Labs  Lab 10/02/21 2010  AST 22  ALT 25  ALKPHOS 37*  BILITOT 0.9  PROT 6.4*  ALBUMIN 3.9   No results for input(s): LIPASE, AMYLASE in the last 168 hours. No results for input(s): AMMONIA in the last 168 hours. Coagulation Profile: No results for input(s): INR, PROTIME in the last 168 hours. Cardiac Enzymes: Recent Labs  Lab 10/02/21 2010  CKTOTAL 207   BNP (last 3 results) No results for input(s): PROBNP in the last 8760 hours. HbA1C: No results  for input(s): HGBA1C in the last 72 hours. CBG: No results for input(s): GLUCAP in the last 168 hours. Lipid Profile: No results for input(s): CHOL, HDL, LDLCALC, TRIG, CHOLHDL, LDLDIRECT in the last 72 hours. Thyroid Function Tests: No results for input(s): TSH, T4TOTAL, FREET4, T3FREE, THYROIDAB in the last 72 hours. Anemia Panel: No results for input(s): VITAMINB12, FOLATE, FERRITIN, TIBC, IRON, RETICCTPCT in the last 72 hours. Urine analysis: No results found for: COLORURINE, APPEARANCEUR, LABSPEC, [place], GLUCOSEU, HGBUR, BILIRUBINUR, KETONESUR, PROTEINUR, UROBILINOGEN, NITRITE, LEUKOCYTESUR  Radiological Exams on Admission: DG Chest Portable 1 View  Result Date: 10/02/2021 CLINICAL DATA:  Syncope EXAM:  PORTABLE CHEST 1 VIEW COMPARISON:  None. FINDINGS: The heart size and mediastinal contours are within normal limits. Both lungs are clear. The visualized skeletal structures are unremarkable. IMPRESSION: No active disease. Electronically Signed   By: Donavan Foil M.D.   On: 10/02/2021 20:25     EKG: Independently reviewed, with result as described above.    Assessment/Plan   Principal Problem:   Syncope Active Problems:   AKI (acute kidney injury) (HCC)   Elevated troponin   HTN (hypertension)     #) Syncope: 2 episode of syncope of the last day that were associated with presyncope, with differential including orthostatic hypotension in the setting of dehydration given that the patient was working out at a kickboxing class immediately preceding the first such episode, and complaining of significant thirst in the minutes leading up to the first episode of syncope, with potential exacerbation by home pharmacologic factors serving to diminish compensatory peripheral vasoconstrictive response in the setting of outpatient amlodipine. Will check orthostatic vital signs, but with the caveat that the patient has already received IVF's in the ED, potentially altering the results of this evaluation.    Not associated with any overt acute focal neurologic deficits. Clinically, acute ischemic stroke versus seizures appear less likely at this time. Presentation appears less consistent with ACS at this time, with presenting EKG showing no evidence of acute ischemic changes, and in the absence of any associated CP.  However, we will plan to continue to trend serial troponin and pursue echocardiogram in the morning, while closely monitoring on telemetry.  In setting of associated prodrome, the possibility of ventricular arrhythmia appears to be less likely at this time, although will closely monitor on telemetry for any e/o potential contributory arrhythmia, while also assessing serum Mg level.  Differential  also includes neurocardiogenic syncope, particularly given increased propensity for incurring pain given that he was incurring trauma via kickboxing.     Plan: I have placed a nursing communication order requesting that orthostatic vital signs x 1 set be checked and documented, following which will initiate gentle IVF's in the form of LR at 75 cc/h. Monitor on telemetry. Hold home amlodipine for now. Monitor strict I's and O's.  Add-on serum Mg level. Check CMP, CBC, serum Mg level in the AM. Fall precautions ordered. Trend trop.  Echo ordered for the morning.      #) Elevated troponin: mildly elevated troponin of 23 following initial level of 12.  Potentially as a consequence of generalized muscle leak given that the patient was participating in kickboxing immediately preceding his presentation.  Will check CK-MB as related to high-sensitivity troponin I to further correlate this possibility.  Additional potential contributing factors include diminished renal clearance in the setting of suspected presenting acute kidney injury, as further detailed below.  Further evaluating for ACS in the context of presenting syncope, as further detailed  above.  As stated above, EKG shows no evidence of acute ischemic changes, including no evidence of STEMI, and CXR showed no acute CP process, including no evidence of pneumothorax.  Additionally, presentation is not associated with any CP.     Plan: repeat troponin in the AM. Monitor on telemetry. PRN EKG for development of chest pain.  Check serum Mg level and repeat BMP in the morning. repeat CBC in the AM. Additional evaluation and management of presenting syncope, as further detailed above.  Echo ordered for the morning.      #) Acute kidney injury: Presenting creatinine found to be elevated at 1.58.  No prior creatinine data points available via chart review, including review of care everywhere.  Therefore, baseline creatinine is not currently clear, and it  is possible that patient may have a history of mild chronic kidney disease and that this elevated creatinine is on par with baseline levels.  However, suspect that mildly elevated presenting creatinine is consistent with acute kidney injury given clinical evidence for dehydration, particularly in the setting of patient's preceding fluid losses via preceding workout session, as above, with AKI representing a potential avenue for diminished renal clearance of troponin leading to mildly elevated value, as above.  Plan: Check urinalysis with microscopy to evaluate for the presence urinary gas.  Add on random urine status post random urine creatinine.  Monitor strict I's and O's and daily weights.  Tempt avoid nephrotoxic agents.  LR at 75 cc/h x 10 hours.  Repeat BMP in the morning.  Check CK-MB.  As above.       #) Essential Hypertension: documented h/o such, with outpatient antihypertensive regimen including amlodipine.  SBP's in the ED today: Normotensive in the 120s to 130s mmHg.  However, in the setting of presenting syncope, with preceding prodrome, raising possibility of orthostatic hypotension in setting of dehydration, will hold home amlodipine for now, while pursuing orthostatic vital signs to further evaluate, as further detailed above.  Plan: Close monitoring of subsequent BP via routine VS, in addition to pursuit of orthostatic vital signs as component of work-up for syncope, as above.  Hold home amlodipine for now.     DVT prophylaxis: SCD's   Code Status: Full code Family Communication: none Disposition Plan: Per Rounding Team Consults called: none;  Admission status: Observation; and cardiac telemetry    PLEASE NOTE THAT DRAGON DICTATION SOFTWARE WAS USED IN THE CONSTRUCTION OF THIS NOTE.   [place] DO [facility]  From [place]   10/03/2021, 1:08 AM

## 2021-10-05 ENCOUNTER — Emergency Department (HOSPITAL_COMMUNITY)
Admission: EM | Admit: 2021-10-05 | Discharge: 2021-10-05 | Disposition: A | Discharge status: Home / Self Care | Payer: 59 | Attending: Emergency Medicine | Admitting: Emergency Medicine

## 2021-10-05 ENCOUNTER — Emergency Department (HOSPITAL_COMMUNITY): Payer: 59

## 2021-10-05 ENCOUNTER — Other Ambulatory Visit: Payer: Self-pay

## 2021-10-05 ENCOUNTER — Encounter (HOSPITAL_COMMUNITY): Payer: Self-pay | Admitting: Emergency Medicine

## 2021-10-05 DIAGNOSIS — R42 Dizziness and giddiness: Secondary | ICD-10-CM | POA: Diagnosis not present

## 2021-10-05 DIAGNOSIS — I10 Essential (primary) hypertension: Secondary | ICD-10-CM | POA: Insufficient documentation

## 2021-10-05 DIAGNOSIS — R55 Syncope and collapse: Secondary | ICD-10-CM | POA: Diagnosis present

## 2021-10-05 LAB — URINALYSIS, ROUTINE W REFLEX MICROSCOPIC
Bilirubin Urine: NEGATIVE
Glucose, UA: NEGATIVE mg/dL
Ketones, ur: NEGATIVE mg/dL
Leukocytes,Ua: NEGATIVE
Nitrite: NEGATIVE
Protein, ur: NEGATIVE mg/dL
Specific Gravity, Urine: 1.025 (ref 1.005–1.030)
pH: 5.5 (ref 5.0–8.0)

## 2021-10-05 LAB — CBC
HCT: 49.2 % (ref 39.0–52.0)
Hemoglobin: 15.4 g/dL (ref 13.0–17.0)
MCH: 25.3 pg — ABNORMAL LOW (ref 26.0–34.0)
MCHC: 31.3 g/dL (ref 30.0–36.0)
MCV: 80.8 fL (ref 80.0–100.0)
Platelets: 251 10*3/uL (ref 150–400)
RBC: 6.09 MIL/uL — ABNORMAL HIGH (ref 4.22–5.81)
RDW: 13.1 % (ref 11.5–15.5)
WBC: 4.6 10*3/uL (ref 4.0–10.5)
nRBC: 0 % (ref 0.0–0.2)

## 2021-10-05 LAB — BASIC METABOLIC PANEL
Anion gap: 8 (ref 5–15)
BUN: 11 mg/dL (ref 6–20)
CO2: 26 mmol/L (ref 22–32)
Calcium: 9.2 mg/dL (ref 8.9–10.3)
Chloride: 104 mmol/L (ref 98–111)
Creatinine, Ser: 1.26 mg/dL — ABNORMAL HIGH (ref 0.61–1.24)
GFR, Estimated: >60 mL/min (ref >60)
Glucose, Bld: 118 mg/dL — ABNORMAL HIGH (ref 70–99)
Potassium: 4.1 mmol/L (ref 3.5–5.1)
Sodium: 138 mmol/L (ref 135–145)

## 2021-10-05 LAB — URINALYSIS, MICROSCOPIC (REFLEX): Bacteria, UA: NONE SEEN

## 2021-10-05 LAB — TROPONIN I (HIGH SENSITIVITY)
Troponin I (High Sensitivity): 17 ng/L (ref <18)
Troponin I (High Sensitivity): 16 ng/L (ref <18)

## 2021-10-05 LAB — CBG MONITORING, ED: Glucose-Capillary: 101 mg/dL — ABNORMAL HIGH (ref 70–99)

## 2021-10-05 MED ORDER — LACTATED RINGERS IV BOLUS
1000.0000 mL | Freq: Once | INTRAVENOUS | Status: AC
  Administered 2021-10-05: 1000 mL via INTRAVENOUS

## 2021-10-05 MED ORDER — SODIUM CHLORIDE 0.9% FLUSH: 3.0000 mL | Freq: Once | INTRAVENOUS | Status: DC

## 2021-10-05 NOTE — ED Provider Notes (Signed)
[facility] EMERGENCY DEPARTMENT Provider Note   CSN: [id number] Arrival date & time: 10/05/21  0830     History  Chief Complaint  Patient presents with   Dizziness    MALE MINISH is a 51 y.o. male.  HPI 51 year old male with a history of hypertension presents with near syncope.  He states around 7 AM he was on a bench drinking coffee and started to feel lightheaded.  This was similar to when he passed out a couple days ago.  He was recently admitted and discharged after observation 2 days ago.  Yesterday he did not do much at all and never felt near syncope.  This morning he noted the lightheadedness coming back and he tried to get up and see if it would get better but never really changed.  It last about 30 minutes.  He is feeling better right now but still has a little bit of lightheadedness/not feeling well.  He never had a thunderclap headache or any significant headache, numbness, weakness, chest pain, shortness of breath, palpitations.  He did not pass out today.  He has been taking his amlodipine since getting home though he did not take it this morning.  He has not eaten this morning though often does not eat a lot.  No exertional activity today.    He denies any other significant past medical history.  He denies smoking.  He states his dad died of heart failure complications recently, but no history of sudden unexplained or cardiac death in the family.  He denies any room spinning sensation or gait instability.  No recent stressors today.  Home Medications Prior to Admission medications   Not on File      Allergies    Shellfish allergy    Review of Systems   Review of Systems  Constitutional:  Negative for fever.  Respiratory:  Negative for shortness of breath.   Cardiovascular:  Negative for chest pain and palpitations.  Gastrointestinal:  Negative for abdominal pain, diarrhea and vomiting.  Musculoskeletal:  Negative for gait problem.   Neurological:  Positive for light-headedness. Negative for syncope, weakness, numbness and headaches.   Physical Exam Updated Vital Signs BP (!) 168/115    Pulse 89    Temp 98.6 F (37 C) (Oral)    Resp 13    SpO2 100%  Physical Exam Vitals and nursing note reviewed.  Constitutional:      General: He is not in acute distress.    Appearance: He is well-developed. He is obese. He is not ill-appearing or diaphoretic.  HENT:     Head: Normocephalic and atraumatic.  Eyes:     Extraocular Movements: Extraocular movements intact.     Pupils: Pupils are equal, round, and reactive to light.  Cardiovascular:     Rate and Rhythm: Normal rate and regular rhythm.     Heart sounds: Normal heart sounds. No murmur heard.    Comments: HR between 80s and low 100s Pulmonary:     Effort: Pulmonary effort is normal.     Breath sounds: Normal breath sounds.  Abdominal:     General: There is no distension.     Palpations: Abdomen is soft.     Tenderness: There is no abdominal tenderness.  Skin:    General: Skin is warm and dry.  Neurological:     Mental Status: He is alert.     Comments: CN 3-12 grossly intact. 5/5 strength in all 4 extremities. Grossly normal sensation. Normal  finger to nose.     ED Results / Procedures / Treatments   Labs (all labs ordered are listed, but only abnormal results are displayed) Labs Reviewed  BASIC METABOLIC PANEL - Abnormal; Notable for the following components:      Result Value   Glucose, Bld 118 (*)    Creatinine, Ser 1.26 (*)    All other components within normal limits  CBC - Abnormal; Notable for the following components:   RBC 6.09 (*)    MCH 25.3 (*)    All other components within normal limits  URINALYSIS, ROUTINE W REFLEX MICROSCOPIC - Abnormal; Notable for the following components:   Hgb urine dipstick TRACE (*)    All other components within normal limits  CBG MONITORING, ED - Abnormal; Notable for the following components:    Glucose-Capillary 101 (*)    All other components within normal limits  URINALYSIS, MICROSCOPIC (REFLEX)  TROPONIN I (HIGH SENSITIVITY)  TROPONIN I (HIGH SENSITIVITY)    EKG EKG Interpretation  Date/Time:  Sunday October 05 2021 08:44:06 EST Ventricular Rate:  97 PR Interval:  160 QRS Duration: 82 QT Interval:  322 QTC Calculation: 408 R Axis:   77 Text Interpretation: Normal sinus rhythm T wave abnormality, consider inferior ischemia t waves overall similar to Jan 12 and 13 Confirmed by Pricilla LovelessGoldston, Cuyler Vandyken [phone]([id number]) on 10/05/2021 10:50:14 AM  Radiology DG Chest 1 View  Result Date: 10/05/2021 CLINICAL DATA:  51 year old male with history of dizziness. Single scratch at recent syncopal episode. EXAM: CHEST  1 VIEW COMPARISON:  Chest x-ray 10/02/2021. FINDINGS: Lung volumes are normal. No consolidative airspace disease. No pleural effusions. No pneumothorax. No pulmonary nodule or mass noted. Pulmonary vasculature and the cardiomediastinal silhouette are within normal limits. IMPRESSION: No radiographic evidence of acute cardiopulmonary disease. Electronically Signed   By: Trudie Reedaniel  Entrikin M.D.   On: 10/05/2021 09:45    Procedures Procedures    Medications Ordered in ED Medications  sodium chloride flush (NS) 0.9 % injection 3 mL (3 mLs Intravenous Not Given 10/05/21 1103)  lactated ringers bolus 1,000 mL (0 mLs Intravenous Stopped 10/05/21 1315)    ED Course/ Medical Decision Making/ A&P                           Medical Decision Making  Patient is well-appearing.  He feels a little bit off but he otherwise has a benign exam.  He was given an IV fluid bolus and allowed to eat and states he is now feeling a lot better.  His ECG was personally reviewed and interpreted and has nonspecific inferior T wave changes but these are the same as the last couple over the last few days.  Chest x-ray has been personally reviewed and interpreted and shows no obvious emergent condition such as  pneumothorax, pneumonia, cardiac enlargement, etc.  Labs were obtained and show a benign urinalysis, troponins that are at the upper limit of normal but flat, and an improved creatinine since admission.  These have been interpreted by myself.  Overall, it is unclear what exactly is causing this lightheadedness sensation.  It does not seem like vertigo neuro exam is benign.  I have a very low suspicion of ACS, PE, CNS emergency such as subarachnoid hemorrhage, etc.  He has been monitored on the telemetry and when I reviewed the cardiac monitoring there were no significant events and he is in sinus rhythm.  At this point he appears stable for discharge  home and has PCP follow-up in 2 days.  I think this is reasonable and we discussed that while we did not find an obvious cause there is no apparent emergent condition.  Discharged home with return precautions.        Final Clinical Impression(s) / ED Diagnoses Final diagnoses:  Near syncope    Rx / DC Orders ED Discharge Orders     None         Pricilla Loveless, MD 10/05/21 1526

## 2021-10-05 NOTE — ED Provider Triage Note (Signed)
Emergency Medicine Provider Triage Evaluation Note  Miguel Marks , a 51 y.o. male  was evaluated in triage.  Pt complains of near syncope episode this morning.  Was recently discharged for similar symptoms where he had 2 episodes of syncope.  He did not pass out this time.  No chest pain or shortness of breath prior.  He did say he got very cold, clammy, and diaphoretic prior to.  Review of Systems  Positive:  Negative: See above   Physical Exam  BP (!) 166/107 (BP Location: Right Arm)    Pulse (!) 107    Temp 98.6 F (37 C) (Oral)    Resp 18    SpO2 100%  Gen:   Awake, no distress   Resp:  Normal effort  MSK:   Moves extremities without difficulty  Other:  Tachycardic  Medical Decision Making  Medically screening exam initiated at 8:49 AM.  Appropriate orders placed.  Miguel Marks was informed that the remainder of the evaluation will be completed by another provider, this initial triage assessment does not replace that evaluation, and the importance of remaining in the ED until their evaluation is complete.     Honor Loh [place], [place] 10/05/21 [phone]

## 2021-10-05 NOTE — ED Triage Notes (Signed)
C/o dizziness since waking up this morning.  No neuro deficits noted.  Seen in ED on Friday for syncopal episode.

## 2021-10-05 NOTE — Discharge Instructions (Signed)
Be sure you are eating correctly and drinking plenty of fluids.  If at any point you develop recurrent symptoms, headache, chest pain, shortness of breath, or any other new/concerning symptoms then return to the ER for evaluation.

## 2021-11-11 ENCOUNTER — Ambulatory Visit (INDEPENDENT_AMBULATORY_CARE_PROVIDER_SITE_OTHER): Payer: 59

## 2021-11-11 ENCOUNTER — Ambulatory Visit: Payer: 59 | Admitting: Cardiology

## 2021-11-11 ENCOUNTER — Other Ambulatory Visit: Payer: Self-pay

## 2021-11-11 ENCOUNTER — Encounter: Payer: Self-pay | Admitting: Cardiology

## 2021-11-11 VITALS — BP 160/96 | HR 90 | Ht 70.0 in | Wt 229.0 lb

## 2021-11-11 DIAGNOSIS — E785 Hyperlipidemia, unspecified: Secondary | ICD-10-CM

## 2021-11-11 DIAGNOSIS — R079 Chest pain, unspecified: Secondary | ICD-10-CM

## 2021-11-11 DIAGNOSIS — I10 Essential (primary) hypertension: Secondary | ICD-10-CM

## 2021-11-11 DIAGNOSIS — R55 Syncope and collapse: Secondary | ICD-10-CM

## 2021-11-11 NOTE — Progress Notes (Signed)
Cardiology Consultation:    Date:  11/11/2021   ID:  Miguel Marks, DOB [birth year], MRN [id number]  PCP:  Soundra Pilon, FNP  Cardiologist:  Gypsy Balsam, MD   Referring MD: Soundra Pilon, FNP   No chief complaint on file. I passed out 3 times  History of Present Illness:    Miguel Marks is a 51 y.o. male who is being seen today for the evaluation of syncope at the request of Soundra Pilon, FNP.  He is a healthy gentleman who exercise on the regular basis.  In January he was practicing kickboxing.  He was punching the bag then started feeling poorly he thought he may be dehydrated started feeling dizzy and started having tunnel vision when he wanted to go and have a drink of water he started walking when he collapsed. Next thing he knows  He is sitting in the chair.  Apparently the nurse was at the gym and she noted that he passed out she laid his legs up and after that she escorted him to the chair he does not remember that.  Then today he felt kind of weak tired and exhausted.  Then eventually narrowing being called supposedly he passed out in the ambulance.  Couple weeks later he was sitting at the computer started feeling poorly and developed some sensation Of tunnel vision strength sweating all nausea decided to go grab some water he did and after that he felt good.  He also describes situation when he was trying to walk on the treadmill he was walking for half an hour everything was fine and then suddenly started feeling poorly.  Since that time he avoided the gym and he is doing quite okay.  Denies having chest pain tightness squeezing pressure burning chest no dizziness or passing out.. Does have a family history of cardiomyopathy as well as coronary artery disease. He does not smoke, never did. He does have high cholesterol. Past Medical History:  Diagnosis Date   HTN (hypertension)     Past Surgical History:  Procedure Laterality Date   NO PAST SURGERIES       Current Medications: Current Meds  Medication Sig   amLODipine (NORVASC) 10 MG tablet Take 10 mg by mouth daily.     Allergies:   Shellfish allergy   Social History   Socioeconomic History   Marital status: Married    Spouse name: Not on file   Number of children: Not on file   Years of education: Not on file   Highest education level: Not on file  Occupational History   Not on file  Tobacco Use   Smoking status: Never    Passive exposure: Never   Smokeless tobacco: Never  Vaping Use   Vaping Use: Never used  Substance and Sexual Activity   Alcohol use: Not Currently   Drug use: Never   Sexual activity: Not on file  Other Topics Concern   Not on file  Social History Narrative   Not on file   Social Determinants of Health   Financial Resource Strain: Not on file  Food Insecurity: Not on file  Transportation Needs: Not on file  Physical Activity: Not on file  Stress: Not on file  Social Connections: Not on file     Family History: The patient's family history includes Diabetes in his paternal grandmother; Healthy in his brother and sister; Heart failure in his father; Hypertension in his mother and sister. ROS:   Please  see the history of present illness.    All 14 point review of systems negative except as described per history of present illness.  EKGs/Labs/Other Studies Reviewed:    The following studies were reviewed today: I did review record from the emergency room for this visit.  Echocardiogram also has been reviewed   1. Left ventricular ejection fraction, by estimation, is 60 to 65%. The  left ventricle has normal function. The left ventricle has no regional  wall motion abnormalities. Left ventricular diastolic parameters were  normal.   2. Right ventricular systolic function is normal. The right ventricular  size is normal. Tricuspid regurgitation signal is inadequate for assessing  PA pressure.   3. Left atrial size was mildly dilated.    4. The mitral valve is normal in structure. Mild mitral valve  regurgitation.   5. The aortic valve is tricuspid. There is mild calcification of the  aortic valve. There is mild thickening of the aortic valve. Aortic valve  regurgitation is not visualized. Aortic valve sclerosis is present, with  no evidence of aortic valve stenosis.   6. The inferior vena cava is normal in size with greater than 50%  respiratory variability, suggesting right atrial pressure of 3 mmHg.   EKG:  EKG is  ordered today.  The ekg ordered today demonstrates normal sinus rhythm, normal P interval, T inversion in inferior leads similar to prior EKGs.  Recent Labs: 10/03/2021: ALT 26; Magnesium 2.2 10/05/2021: BUN 11; Creatinine, Ser 1.26; Hemoglobin 15.4; Platelets 251; Potassium 4.1; Sodium 138  Recent Lipid Panel No results found for: CHOL, TRIG, HDL, CHOLHDL, VLDL, LDLCALC, LDLDIRECT  Physical Exam:    VS:  BP (!) 160/96 (BP Location: Left Arm)    Pulse 90    Ht 5\' 10"  (1.778 m)    Wt 229 lb (103.9 kg)    SpO2 98%    BMI 32.86 kg/m     Wt Readings from Last 3 Encounters:  11/11/21 229 lb (103.9 kg)  10/02/21 225 lb (102.1 kg)     GEN:  Well nourished, well developed in no acute distress HEENT: Normal NECK: No JVD; No carotid bruits LYMPHATICS: No lymphadenopathy CARDIAC: RRR, no murmurs, no rubs, no gallops RESPIRATORY:  Clear to auscultation without rales, wheezing or rhonchi  ABDOMEN: Soft, non-tender, non-distended MUSCULOSKELETAL:  No edema; No deformity  SKIN: Warm and dry NEUROLOGIC:  Alert and oriented x 3 PSYCHIATRIC:  Normal affect   ASSESSMENT:    1. Syncope, unspecified syncope type   2. Primary hypertension   3. Dyslipidemia    PLAN:    In order of problems listed above:  Syncope worrisome happening during exercise.  I will ask him to wear Zio patch AT, will also schedule him to have a heart MRI to rule out any structural heart disease.  He does have premature cardiac death in  the family.  Asking also to avoid exercise also asked him to stay well-hydrated.  I did review his echocardiogram shows structurally normal heart. Essential hypertension: Blood pressure elevated today, this is first visit in my office therefore we will continue monitoring this. Dyslipidemia I did review his K PN which show LDL of 161 HDL 45, I did calculate his risk which is intermediate, I wanted to put him on cholesterol medication he does not want to continue with the   Medication Adjustments/Labs and Tests Ordered: Current medicines are reviewed at length with the patient today.  Concerns regarding medicines are outlined above.  No orders  of the defined types were placed in this encounter.  No orders of the defined types were placed in this encounter.   Signed, Park Liter, MD, [facility]. 11/11/2021 3:15 PM    [place]

## 2021-11-11 NOTE — Patient Instructions (Signed)
Medication Instructions:  Your physician recommends that you continue on your current medications as directed. Please refer to the Current Medication list given to you today.  *If you need a refill on your cardiac medications before your next appointment, please call your pharmacy*   Lab Work: None If you have labs (blood work) drawn today and your tests are completely normal, you will receive your results only by: MyChart Message (if you have MyChart) OR A paper copy in the mail If you have any lab test that is abnormal or we need to change your treatment, we will call you to review the results.   Testing/Procedures: Your physician has requested that you have a cardiac MRI. Cardiac MRI uses a computer to create images of your heart as its beating, producing both still and moving pictures of your heart and major blood vessels. For further information please visit [email or web address]. Please follow the instruction sheet given to you today for more information.  A zio monitor was ordered today. It will remain on for 14 days. You will then return monitor and event diary in provided box. It takes 1-2 weeks for report to be downloaded and returned to [place]. We will call you with the results. If monitor falls off or has orange flashing light, please call Zio for further instructions.      Follow-Up: At [facility], you and your health needs are our priority.  As part of our continuing mission to provide you with exceptional heart care, we have created designated Provider Care Teams.  These Care Teams include your primary Cardiologist (physician) and Advanced Practice Providers (APPs -  Physician Assistants and Nurse Practitioners) who all work together to provide you with the care you need, when you need it.  We recommend signing up for the patient portal called "MyChart".  Sign up information is provided on this After Visit Summary.  MyChart is used to connect with patients for Virtual Visits  (Telemedicine).  Patients are able to view lab/test results, encounter notes, upcoming appointments, etc.  Non-urgent messages can be sent to your provider as well.   To learn more about what you can do with MyChart, go to [email or web address].    Your next appointment:   6 week(s)  The format for your next appointment:   In Person  Provider:   Gypsy Balsam, MD    Other Instructions None

## 2021-11-11 NOTE — Addendum Note (Signed)
Addended by: Edwyna Shell I on: 11/11/2021 04:01 PM   Modules accepted: Orders

## 2021-12-16 ENCOUNTER — Telehealth (HOSPITAL_COMMUNITY): Payer: Self-pay | Admitting: *Deleted

## 2021-12-16 NOTE — Telephone Encounter (Signed)
Attempted to call patient regarding upcoming cardiac MRI appointment. Left message on voicemail with name and callback number  Shayleen Eppinger RN Navigator Cardiac Imaging [facility] [phone] Office [phone] Cell  

## 2021-12-17 ENCOUNTER — Ambulatory Visit (HOSPITAL_COMMUNITY)
Admission: RE | Admit: 2021-12-17 | Discharge: 2021-12-17 | Disposition: A | Discharge status: Home / Self Care | Payer: 59 | Source: Ambulatory Visit | Attending: Cardiology | Admitting: Cardiology

## 2021-12-17 ENCOUNTER — Other Ambulatory Visit: Payer: Self-pay

## 2021-12-17 DIAGNOSIS — R079 Chest pain, unspecified: Secondary | ICD-10-CM | POA: Insufficient documentation

## 2021-12-17 MED ORDER — GADOBUTROL 1 MMOL/ML IV SOLN
10.0000 mL | Freq: Once | INTRAVENOUS | Status: AC | PRN
  Administered 2021-12-17: 10 mL via INTRAVENOUS

## 2021-12-23 DIAGNOSIS — N1831 Chronic kidney disease, stage 3a: Secondary | ICD-10-CM | POA: Insufficient documentation

## 2021-12-23 DIAGNOSIS — E669 Obesity, unspecified: Secondary | ICD-10-CM | POA: Insufficient documentation

## 2021-12-24 ENCOUNTER — Ambulatory Visit: Payer: 59 | Admitting: Cardiology

## 2021-12-24 ENCOUNTER — Encounter: Payer: Self-pay | Admitting: Cardiology

## 2021-12-24 VITALS — BP 146/82 | HR 104 | Ht 70.0 in | Wt 231.8 lb

## 2021-12-24 DIAGNOSIS — E785 Hyperlipidemia, unspecified: Secondary | ICD-10-CM

## 2021-12-24 DIAGNOSIS — I10 Essential (primary) hypertension: Secondary | ICD-10-CM | POA: Diagnosis not present

## 2021-12-24 DIAGNOSIS — R55 Syncope and collapse: Secondary | ICD-10-CM

## 2021-12-24 DIAGNOSIS — R079 Chest pain, unspecified: Secondary | ICD-10-CM | POA: Diagnosis not present

## 2021-12-24 NOTE — Patient Instructions (Signed)
Medication Instructions:  ?Your physician recommends that you continue on your current medications as directed. Please refer to the Current Medication list given to you today. ? ?*If you need a refill on your cardiac medications before your next appointment, please call your pharmacy* ? ? ?Lab Work: ?None ?If you have labs (blood work) drawn today and your tests are completely normal, you will receive your results only by: ?MyChart Message (if you have MyChart) OR ?A paper copy in the mail ?If you have any lab test that is abnormal or we need to change your treatment, we will call you to review the results. ? ? ?Testing/Procedures: ? ? ?[facility] ?[address] ?[place], [place] [address] ?Phone:  [phone] ? ? ? ?Please arrive 15 minutes prior to your appointment time for registration and insurance purposes. ? ?The test will take approximately 3 to 4 hours to complete; you may bring reading material.  If someone comes with you to your appointment, they will need to remain in the main lobby due to limited space in the testing area. **If you are pregnant or breastfeeding, please notify the nuclear lab prior to your appointment** ? ?How to prepare for your Myocardial Perfusion Test: ?Do not eat or drink 3 hours prior to your test, except you may have water. ?Do not consume products containing caffeine (regular or decaffeinated) 12 hours prior to your test. (ex: coffee, chocolate, sodas, tea). ?Do bring a list of your current medications with you.  If not listed below, you may take your medications as normal. ?Do wear comfortable clothes (no dresses or overalls) and walking shoes, tennis shoes preferred (No heels or open toe shoes are allowed). ?Do NOT wear cologne, perfume, aftershave, or lotions (deodorant is allowed). ?If these instructions are not followed, your test will have to be rescheduled. ? ?Please report to [address] for your test.  If you have questions or  concerns about your appointment, you can call the [facility] at [phone]. ? ?If you cannot keep your appointment, please provide 24 hours notification to the Nuclear Lab, to avoid a possible $50 charge to your account.  ? ? ?Follow-Up: ?At [facility], you and your health needs are our priority.  As part of our continuing mission to provide you with exceptional heart care, we have created designated Provider Care Teams.  These Care Teams include your primary Cardiologist (physician) and Advanced Practice Providers (APPs -  Physician Assistants and Nurse Practitioners) who all work together to provide you with the care you need, when you need it. ? ?We recommend signing up for the patient portal called "MyChart".  Sign up information is provided on this After Visit Summary.  MyChart is used to connect with patients for Virtual Visits (Telemedicine).  Patients are able to view lab/test results, encounter notes, upcoming appointments, etc.  Non-urgent messages can be sent to your provider as well.   ?To learn more about what you can do with MyChart, go to [email or web address].   ? ?Your next appointment:   ?3 month(s) ? ?The format for your next appointment:   ?In Person ? ?Provider:   ?Jenne Campus, MD  ? ? ?Other Instructions ?None ? ?

## 2021-12-24 NOTE — Progress Notes (Signed)
Ekg

## 2021-12-24 NOTE — Progress Notes (Signed)
?Cardiology Office Note:   ? ?Date:  12/24/2021  ? ?ID:  Miguel Marks, DOB 08-15-1971, MRN [id number] ? ?PCP:  Soundra Pilon, FNP  ?Cardiologist:  Gypsy Balsam, MD   ? ?Referring MD: Soundra Pilon, FNP  ? ?Chief Complaint  ?Patient presents with  ? Follow-up  ?I am doing fine ? ?History of Present Illness:   ? ?Miguel Marks is a 51 y.o. male with past medical history significant for essential hypertension, dyslipidemia.  He did have quite extensive evaluation performed since he was referred to [place] because of syncope.  Some of his episode of dizziness were somewhat concerning for example did happen when he was doing exercises.  Evaluation so far included Zio patch which did not show any critical arrhythmia, he also had MRI of his heart which basically shows structurally normal heart without any evidence of right ventricle dysplasia cardiomyopathy, he also have echocardiogram which basically shows structurally normal heart. ?, So far we have no explanation for his syncope.  There is still a chance that he may have exercise/catecholamine induced arrhythmia therefore I think it would be reasonable to perform exercise stress test on him.  Since that time I seen him he denies have any problem but also is afraid to exercise.  So he did not do any extraordinary efforts. ? ?Past Medical History:  ?Diagnosis Date  ? HTN (hypertension)   ? ? ?Past Surgical History:  ?Procedure Laterality Date  ? NO PAST SURGERIES    ? ? ?Current Medications: ?Current Meds  ?Medication Sig  ? amLODipine (NORVASC) 10 MG tablet Take 10 mg by mouth daily.  ?  ? ?Allergies:   Shellfish allergy  ? ?Social History  ? ?Socioeconomic History  ? Marital status: Married  ?  Spouse name: Not on file  ? Number of children: Not on file  ? Years of education: Not on file  ? Highest education level: Not on file  ?Occupational History  ? Not on file  ?Tobacco Use  ? Smoking status: Never  ?  Passive exposure: Never  ? Smokeless tobacco: Never  ?Vaping  Use  ? Vaping Use: Never used  ?Substance and Sexual Activity  ? Alcohol use: Not Currently  ? Drug use: Never  ? Sexual activity: Not on file  ?Other Topics Concern  ? Not on file  ?Social History Narrative  ? Not on file  ? ?Social Determinants of Health  ? ?Financial Resource Strain: Not on file  ?Food Insecurity: Not on file  ?Transportation Needs: Not on file  ?Physical Activity: Not on file  ?Stress: Not on file  ?Social Connections: Not on file  ?  ? ?Family History: ?The patient's family history includes Diabetes in his paternal grandmother; Healthy in his brother and sister; Heart failure in his father; Hypertension in his mother and sister. ?ROS:   ?Please see the history of present illness.    ?All 14 point review of systems negative except as described per history of present illness ? ?EKGs/Labs/Other Studies Reviewed:   ? ? ? ?Recent Labs: ?10/03/2021: ALT 26; Magnesium 2.2 ?10/05/2021: BUN 11; Creatinine, Ser 1.26; Hemoglobin 15.4; Platelets 251; Potassium 4.1; Sodium 138  ?Recent Lipid Panel ?No results found for: CHOL, TRIG, HDL, CHOLHDL, VLDL, LDLCALC, LDLDIRECT ? ?Physical Exam:   ? ?VS:  BP (!) 146/82 (BP Location: Left Arm, Patient Position: Sitting)   Pulse (!) 104   Ht 5\' 10"  (1.778 m)   Wt 231 lb 12.8 oz (105.1 kg)  SpO2 98%   BMI 33.26 kg/m?    ? ?Wt Readings from Last 3 Encounters:  ?12/24/21 231 lb 12.8 oz (105.1 kg)  ?11/11/21 229 lb (103.9 kg)  ?10/02/21 225 lb (102.1 kg)  ?  ? ?GEN:  Well nourished, well developed in no acute distress ?HEENT: Normal ?NECK: No JVD; No carotid bruits ?LYMPHATICS: No lymphadenopathy ?CARDIAC: RRR, no murmurs, no rubs, no gallops ?RESPIRATORY:  Clear to auscultation without rales, wheezing or rhonchi  ?ABDOMEN: Soft, non-tender, non-distended ?MUSCULOSKELETAL:  No edema; No deformity  ?SKIN: Warm and dry ?LOWER EXTREMITIES: no swelling ?NEUROLOGIC:  Alert and oriented x 3 ?PSYCHIATRIC:  Normal affect  ? ?ASSESSMENT:   ? ?1. Syncope, unspecified syncope  type   ?2. Primary hypertension   ?3. Dyslipidemia   ? ?PLAN:   ? ?In order of problems listed above: ? ?Syncope with some worrisome characteristic.  So far work-up negative I think the best move will be to do EKG treadmill stress test to make sure he does not have any exercise-induced arrhythmias.  If that will be negative I will recommend gradually go back to exercises making sure he gets these systolics well-hydrated. ?Essential hypertension blood pressure slightly on the higher side but continue following that. ?Dyslipidemia we have discussion already about potentially starting statin he does not want to do it and I think hopefully he will be able to go back to his exercise by doing so will be able to get his cholesterol better under control and then if that is not sufficient we will initiate therapy with medications ? ? ?Medication Adjustments/Labs and Tests Ordered: ?Current medicines are reviewed at length with the patient today.  Concerns regarding medicines are outlined above.  ?No orders of the defined types were placed in this encounter. ? ?Medication changes: No orders of the defined types were placed in this encounter. ? ? ?Signed, ?Georgeanna Lea, MD, [facility] ?12/24/2021 1:34 PM    ?[facility] ? ?

## 2021-12-24 NOTE — Addendum Note (Signed)
Addended by: Edwyna Shell I on: 12/24/2021 01:53 PM ? ? Modules accepted: Orders ? ?

## 2021-12-25 ENCOUNTER — Telehealth (HOSPITAL_COMMUNITY): Payer: Self-pay | Admitting: *Deleted

## 2021-12-25 NOTE — Telephone Encounter (Signed)
Left message on voicemail per DPR in reference to upcoming appointment scheduled on 12/30/21 with detailed instructions given per Myocardial Perfusion Study Information Sheet for the test. LM to arrive 15 minutes early, and that it is imperative to arrive on time for appointment to keep from having the test rescheduled. If you need to cancel or reschedule your appointment, please call the office within 24 hours of your appointment. Failure to do so may result in a cancellation of your appointment, and a $50 no show fee. Phone number given for call back for any questions. Ricky Ala ? ? ?

## 2021-12-30 ENCOUNTER — Other Ambulatory Visit: Payer: Self-pay

## 2021-12-30 ENCOUNTER — Encounter (HOSPITAL_COMMUNITY): Payer: 59

## 2021-12-30 ENCOUNTER — Encounter (HOSPITAL_COMMUNITY): Payer: Self-pay | Admitting: *Deleted

## 2021-12-30 ENCOUNTER — Telehealth (HOSPITAL_COMMUNITY): Payer: Self-pay | Admitting: *Deleted

## 2021-12-30 DIAGNOSIS — R079 Chest pain, unspecified: Secondary | ICD-10-CM

## 2021-12-30 NOTE — Telephone Encounter (Signed)
Left message on voicemail per DPR in reference to upcoming appointment scheduled on 01/06/22 at 0730 with detailed instructions given per Myocardial Perfusion Study Information Sheet for the test. LM to arrive 15 minutes early, and that it is imperative to arrive on time for appointment to keep from having the test rescheduled. If you need to cancel or reschedule your appointment, please call the office within 24 hours of your appointment. Failure to do so may result in a cancellation of your appointment, and a $50 no show fee. Phone number given for call back for any questions.Mychart letter sent .Miguel Marks, [name] ?  ? ?

## 2021-12-30 NOTE — Progress Notes (Signed)
f °

## 2022-01-06 ENCOUNTER — Ambulatory Visit (HOSPITAL_COMMUNITY): Payer: 59 | Attending: Cardiology

## 2022-01-06 DIAGNOSIS — R079 Chest pain, unspecified: Secondary | ICD-10-CM | POA: Diagnosis present

## 2022-01-06 LAB — MYOCARDIAL PERFUSION IMAGING
Estimated workload: 8.5
Exercise duration (min): 7 min
Exercise duration (sec): 0 s
LV dias vol: 85 mL (ref 62–150)
LV sys vol: 34 mL
MPHR: 168 {beats}/min
Nuc Stress EF: 60 %
Peak HR: 157 {beats}/min
Percent HR: 93 %
Rest HR: 77 {beats}/min
Rest Nuclear Isotope Dose: 10.5 mCi
SDS: 0
SRS: 0
SSS: 0
ST Depression (mm): 0 mm
Stress Nuclear Isotope Dose: 32.1 mCi
TID: 0.95

## 2022-01-06 MED ORDER — TECHNETIUM TC 99M TETROFOSMIN IV KIT
32.1000 | PACK | Freq: Once | INTRAVENOUS | Status: AC | PRN
  Administered 2022-01-06: 32.1 via INTRAVENOUS
  Filled 2022-01-06: qty 33

## 2022-01-06 MED ORDER — TECHNETIUM TC 99M TETROFOSMIN IV KIT
10.5000 | PACK | Freq: Once | INTRAVENOUS | Status: AC | PRN
  Administered 2022-01-06: 10.5 via INTRAVENOUS
  Filled 2022-01-06: qty 11

## 2022-01-08 ENCOUNTER — Telehealth: Payer: Self-pay | Admitting: Cardiology

## 2022-01-08 NOTE — Telephone Encounter (Signed)
?  Pt calling, requesting if Dr. 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

## 2022-01-13 NOTE — Telephone Encounter (Signed)
Pt calling back to f/u on Doctors Note. Pt stated that he need note by Friday (4/28) if possible due to him having to report for training Saturday ( 4/29) Please advise ?

## 2022-01-15 NOTE — Telephone Encounter (Signed)
Dr. Bing Matter looked over all of pts tests. 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 LVM for pt. Encouraged him to call if he has any questions or concerns. ?

## 2022-03-25 ENCOUNTER — Ambulatory Visit: Payer: 59 | Admitting: Cardiology

## 2022-03-25 ENCOUNTER — Encounter: Payer: Self-pay | Admitting: Cardiology

## 2022-03-25 VITALS — BP 140/90 | HR 74 | Ht 70.0 in | Wt 214.4 lb

## 2022-03-25 DIAGNOSIS — R55 Syncope and collapse: Secondary | ICD-10-CM

## 2022-03-25 DIAGNOSIS — I10 Essential (primary) hypertension: Secondary | ICD-10-CM | POA: Diagnosis not present

## 2022-03-25 DIAGNOSIS — E785 Hyperlipidemia, unspecified: Secondary | ICD-10-CM | POA: Diagnosis not present

## 2022-03-25 NOTE — Addendum Note (Signed)
Addended by: Baldo Ash D on: 03/25/2022 04:33 PM   Modules accepted: Orders

## 2022-03-25 NOTE — Patient Instructions (Addendum)
Medication Instructions:  Your physician recommends that you continue on your current medications as directed. Please refer to the Current Medication list given to you today.  *If you need a refill on your cardiac medications before your next appointment, please call your pharmacy*   Lab Work: Lipid panel- today If you have labs (blood work) drawn today and your tests are completely normal, you will receive your results only by: MyChart Message (if you have MyChart) OR A paper copy in the mail If you have any lab test that is abnormal or we need to change your treatment, we will call you to review the results.   Testing/Procedures: None Ordered   Follow-Up: At [facility], you and your health needs are our priority.  As part of our continuing mission to provide you with exceptional heart care, we have created designated Provider Care Teams.  These Care Teams include your primary Cardiologist (physician) and Advanced Practice Providers (APPs -  Physician Assistants and Nurse Practitioners) who all work together to provide you with the care you need, when you need it.  We recommend signing up for the patient portal called "MyChart".  Sign up information is provided on this After Visit Summary.  MyChart is used to connect with patients for Virtual Visits (Telemedicine).  Patients are able to view lab/test results, encounter notes, upcoming appointments, etc.  Non-urgent messages can be sent to your provider as well.   To learn more about what you can do with MyChart, go to [email or web address].mychart.com.    Your next appointment:   6 month(s)  The format for your next appointment:   In Person  Provider:   Robert Krasowski, MD    Other Instructions NA  

## 2022-03-25 NOTE — Progress Notes (Signed)
Cardiology Office Note:    Date:  03/25/2022   ID:  Miguel Marks, DOB 1970-12-08, MRN [id number]  PCP:  Soundra Pilon, FNP  Cardiologist:  Gypsy Balsam, MD    Referring MD: Soundra Pilon, FNP   Chief Complaint  Patient presents with   exercise regimen    Discuss limitations   elevated HR    With intense work outs    History of Present Illness:    Miguel Marks is a 51 y.o. male he was referred to [place] because of episode of syncope, some of this episode had some very worrisome characteristic and extensive evaluation has been performed that included echocardiogram which was normal, heart MRI which did not show any RV dysplasia no infiltrative disease overall was normal, he also got a stress test to make sure he does not have any inducible ischemia as well as to look for any exercise-induced arrhythmia like catecholamine ventricular tachycardia.  Likely it was negative all testing has been negative he is gradually try to go back to exercises however noticed that his heart rate goes fast very quickly when he tried to exercise also take long time to slow down I told him this is a sign of deconditioning gradually he need to build up his stamina which she is planning to do.  Denies have any more syncope, no chest pain tightness squeezing pressure burning chest.  Additional issue of his high blood pressure which is slightly elevated however since he is planning to exercise more aggressively that should get better.  Another concern is his dyslipidemia I did review K PN which show me his LDL of 161 I wanted to start medication on him he prefers to have his cholesterol rechecked.  He changed dramatically his lifestyle he is a vegetarian now.  Past Medical History:  Diagnosis Date   HTN (hypertension)     Past Surgical History:  Procedure Laterality Date   NO PAST SURGERIES      Current Medications: Current Meds  Medication Sig   amLODipine (NORVASC) 10 MG tablet Take 10 mg by mouth  daily.     Allergies:   Shellfish allergy   Social History   Socioeconomic History   Marital status: Married    Spouse name: Not on file   Number of children: Not on file   Years of education: Not on file   Highest education level: Not on file  Occupational History   Not on file  Tobacco Use   Smoking status: Never    Passive exposure: Never   Smokeless tobacco: Never  Vaping Use   Vaping Use: Never used  Substance and Sexual Activity   Alcohol use: Not Currently   Drug use: Never   Sexual activity: Not on file  Other Topics Concern   Not on file  Social History Narrative   Not on file   Social Determinants of Health   Financial Resource Strain: Not on file  Food Insecurity: Not on file  Transportation Needs: Not on file  Physical Activity: Not on file  Stress: Not on file  Social Connections: Not on file     Family History: The patient's family history includes Diabetes in his paternal grandmother; Healthy in his brother and sister; Heart failure in his father; Hypertension in his mother and sister. ROS:   Please see the history of present illness.    All 14 point review of systems negative except as described per history of present illness  EKGs/Labs/Other Studies  Reviewed:      Recent Labs: 10/03/2021: ALT 26; Magnesium 2.2 10/05/2021: BUN 11; Creatinine, Ser 1.26; Hemoglobin 15.4; Platelets 251; Potassium 4.1; Sodium 138  Recent Lipid Panel No results found for: "CHOL", "TRIG", "HDL", "CHOLHDL", "VLDL", "LDLCALC", "LDLDIRECT"  Physical Exam:    VS:  BP 140/90 (BP Location: Left Arm, Patient Position: Sitting)   Pulse 74   Ht 5\' 10"  (1.778 m)   Wt 214 lb 6.4 oz (97.3 kg)   SpO2 99%   BMI 30.76 kg/m     Wt Readings from Last 3 Encounters:  03/25/22 214 lb 6.4 oz (97.3 kg)  01/06/22 231 lb (104.8 kg)  12/24/21 231 lb 12.8 oz (105.1 kg)     GEN:  Well nourished, well developed in no acute distress HEENT: Normal NECK: No JVD; No carotid  bruits LYMPHATICS: No lymphadenopathy CARDIAC: RRR, no murmurs, no rubs, no gallops RESPIRATORY:  Clear to auscultation without rales, wheezing or rhonchi  ABDOMEN: Soft, non-tender, non-distended MUSCULOSKELETAL:  No edema; No deformity  SKIN: Warm and dry LOWER EXTREMITIES: no swelling NEUROLOGIC:  Alert and oriented x 3 PSYCHIATRIC:  Normal affect   ASSESSMENT:    1. Syncope, unspecified syncope type   2. Dyslipidemia   3. Primary hypertension    PLAN:    In order of problems listed above:  Syncope denies having any, work-up negative for gradually asking to go back to exercises.  I also told him that he need to drink plenty of fluids while exercises. Dyslipidemia, he became vegetarian he would like to have his cholesterol checked before committing to cholesterol medication. Essential hypertension blood pressure slightly elevated today, however, will ask him to keep watching his blood pressure if it is elevated then we will maximize his medications, I suspect with exercise and good diet his blood pressure will go down   Medication Adjustments/Labs and Tests Ordered: Current medicines are reviewed at length with the patient today.  Concerns regarding medicines are outlined above.  No orders of the defined types were placed in this encounter.  Medication changes: No orders of the defined types were placed in this encounter.   Signed, 02/23/22, MD, [facility] 03/25/2022 4:28 PM    [facility]

## 2022-03-26 LAB — LIPID PANEL
Chol/HDL Ratio: 3.4 ratio (ref 0.0–5.0)
Cholesterol, Total: 157 mg/dL (ref 100–199)
HDL: 46 mg/dL (ref >39)
LDL Chol Calc (NIH): 99 mg/dL (ref 0–99)
Triglycerides: 59 mg/dL (ref 0–149)
VLDL Cholesterol Cal: 12 mg/dL (ref 5–40)

## 2022-03-27 ENCOUNTER — Telehealth: Payer: Self-pay

## 2022-03-27 NOTE — Telephone Encounter (Signed)
Results reviewed with pt as per Dr. Krasowski's note.  Pt verbalized understanding and had no additional questions. Routed to PCP  

## 2022-11-11 ENCOUNTER — Other Ambulatory Visit (HOSPITAL_COMMUNITY): Payer: Self-pay | Admitting: Family Medicine

## 2022-11-11 ENCOUNTER — Ambulatory Visit (HOSPITAL_COMMUNITY)
Admission: RE | Admit: 2022-11-11 | Discharge: 2022-11-11 | Disposition: A | Discharge status: Home / Self Care | Payer: 59 | Source: Ambulatory Visit | Attending: Vascular Surgery | Admitting: Vascular Surgery

## 2022-11-11 DIAGNOSIS — I7090 Unspecified atherosclerosis: Secondary | ICD-10-CM | POA: Diagnosis not present

## 2022-11-26 NOTE — Progress Notes (Signed)
Office Note     CC:  bilateral asymptomatic carotid disease Requesting Provider:  Kristen Loader, FNP  HPI: Miguel Marks is a 52 y.o. (04-Nov-1970) male presenting at the request of .Kristen Loader, FNP for evaluation of bilateral asymptomatic carotid disease.  On exam, Miguel Marks was doing well.  He was accompanied by his wife.  Originally from [facility] (near [place]), he moved to [place] roughly 22 years ago.  He and his wife have been married for 20 years, and have 2 children -son in college at [employer], their daughter at [employer] high school.  Works as an Chief Financial Officer for [employer].  [name] underwent chest x-ray due to some left arm intermittent tingling and numbness.  This was followed by bilateral carotid ultrasound due to some haziness appreciated on the x-ray.  He denies history of stroke, TIA, amaurosis.  Medications include statin, aspirin. Nonsmoker.  Past Medical History:  Diagnosis Date   HTN (hypertension)     Past Surgical History:  Procedure Laterality Date   NO PAST SURGERIES      Social History   Socioeconomic History   Marital status: Married    Spouse name: Not on file   Number of children: Not on file   Years of education: Not on file   Highest education level: Not on file  Occupational History   Not on file  Tobacco Use   Smoking status: Never    Passive exposure: Never   Smokeless tobacco: Never  Vaping Use   Vaping Use: Never used  Substance and Sexual Activity   Alcohol use: Not Currently   Drug use: Never   Sexual activity: Not on file  Other Topics Concern   Not on file  Social History Narrative   Not on file   Social Determinants of Health   Financial Resource Strain: Not on file  Food Insecurity: Not on file  Transportation Needs: Not on file  Physical Activity: Not on file  Stress: Not on file  Social Connections: Not on file  Intimate Partner Violence: Not on file   Family History  Problem  Relation Age of Onset   Hypertension Mother    Heart failure Father    Hypertension Sister    Healthy Sister    Healthy Brother    Diabetes Paternal Grandmother     Current Outpatient Medications  Medication Sig Dispense Refill   amLODipine (NORVASC) 10 MG tablet Take 10 mg by mouth daily.     No current facility-administered medications for this visit.    Allergies  Allergen Reactions   Shellfish Allergy Anaphylaxis     REVIEW OF SYSTEMS:  [X]  denotes positive finding, [ ]  denotes negative finding Cardiac  Comments:  Chest pain or chest pressure:    Shortness of breath upon exertion:    Short of breath when lying flat:    Irregular heart rhythm:        Vascular    Pain in calf, thigh, or hip brought on by ambulation:    Pain in feet at night that wakes you up from your sleep:     Blood clot in your veins:    Leg swelling:         Pulmonary    Oxygen at home:    Productive cough:     Wheezing:         Neurologic    Sudden weakness in arms or legs:     Sudden numbness in arms or legs:  Sudden onset of difficulty speaking or slurred speech:    Temporary loss of vision in one eye:     Problems with dizziness:         Gastrointestinal    Blood in stool:     Vomited blood:         Genitourinary    Burning when urinating:     Blood in urine:        Psychiatric    Major depression:         Hematologic    Bleeding problems:    Problems with blood clotting too easily:        Skin    Rashes or ulcers:        Constitutional    Fever or chills:      PHYSICAL EXAMINATION:  There were no vitals filed for this visit.  General:  WDWN in NAD; vital signs documented above Gait: Not observed HENT: WNL, normocephalic Pulmonary: normal non-labored breathing , without wheezing Cardiac: regular HR Abdomen: soft, NT, no masses Skin: without rashes Vascular Exam/Pulses:  Right Left  Radial 2+ (normal) 2+ (normal)  Ulnar    Femoral    Popliteal    DP     PT     Extremities: without ischemic changes, without Gangrene , without cellulitis; without open wounds;  Musculoskeletal: no muscle wasting or atrophy  Neurologic: A&O X 3;  No focal weakness or paresthesias are detected Psychiatric:  The pt has Normal affect.   Non-Invasive Vascular Imaging:   Right Carotid Findings:  +----------+--------+--------+--------+------------------+--------+           PSV cm/sEDV cm/sStenosisPlaque DescriptionComments  +----------+--------+--------+--------+------------------+--------+  CCA Prox  124     33                                          +----------+--------+--------+--------+------------------+--------+  CCA Mid   109     29                                          +----------+--------+--------+--------+------------------+--------+  CCA Distal86      24              heterogenous                +----------+--------+--------+--------+------------------+--------+  ICA Prox  109     33      1-39%   heterogenous                +----------+--------+--------+--------+------------------+--------+  ICA Mid   73      30                                          +----------+--------+--------+--------+------------------+--------+  ICA Distal62      32                                          +----------+--------+--------+--------+------------------+--------+  ECA      99      25                                          +----------+--------+--------+--------+------------------+--------+   +----------+--------+-------+----------------+-------------------+  PSV cm/sEDV cmsDescribe        Arm Pressure (mmHG)  +----------+--------+-------+----------------+-------------------+  [id number]           Multiphasic, [id number]                  +----------+--------+-------+----------------+-------------------+   +---------+--------+--+--------+---------+  VertebralPSV cm/s71EDV  cm/sAntegrade  +---------+--------+--+--------+---------+      Left Carotid Findings:  +----------+--------+--------+--------+------------------+--------+           PSV cm/sEDV cm/sStenosisPlaque DescriptionComments  +----------+--------+--------+--------+------------------+--------+  CCA Prox  132     38                                          +----------+--------+--------+--------+------------------+--------+  CCA Mid   111     35                                          +----------+--------+--------+--------+------------------+--------+  CCA Distal101     34              homogeneous                 +----------+--------+--------+--------+------------------+--------+  ICA Prox  42      15      1-39%   homogeneous                 +----------+--------+--------+--------+------------------+--------+  ICA Mid   74      39                                          +----------+--------+--------+--------+------------------+--------+  ICA Distal71      37                                          +----------+--------+--------+--------+------------------+--------+  ECA      97      24                                          +----------+--------+--------+--------+------------------+--------+   +----------+--------+--------+----------------+-------------------+           PSV cm/sEDV cm/sDescribe        Arm Pressure (mmHG)  +----------+--------+--------+----------------+-------------------+  [id number]            Multiphasic, [id number]                  +----------+--------+--------+----------------+-------------------+   +---------+--------+--+--------+--+---------+  VertebralPSV cm/s69EDV cm/s25Antegrade  +---------+--------+--+--------+--+---------+     ASSESSMENT/PLAN: Miguel Marks is a 52 y.o. male presenting with mild, asymptomatic, bilateral carotid artery stenosis.  We had a long conversation regarding  carotid artery stenosis, including the natural history.  Currently he is on best medical therapy.  I plan to see Decarlos on a yearly basis to ensure there is no accelerated atherosclerotic disease.   Should he have no progression in the coming years, I plan to likely extend this.  Broadus John, MD [facility] [phone]

## 2022-11-27 ENCOUNTER — Ambulatory Visit: Payer: 59 | Admitting: Vascular Surgery

## 2022-11-27 ENCOUNTER — Encounter: Payer: Self-pay | Admitting: Vascular Surgery

## 2022-11-27 VITALS — BP 134/82 | HR 86 | Temp 98.1°F | Resp 20 | Ht 70.0 in | Wt 206.0 lb

## 2022-11-27 DIAGNOSIS — I6523 Occlusion and stenosis of bilateral carotid arteries: Secondary | ICD-10-CM | POA: Diagnosis not present

## 2022-12-16 ENCOUNTER — Encounter: Payer: Self-pay | Admitting: Cardiology

## 2022-12-16 ENCOUNTER — Ambulatory Visit: Payer: 59 | Attending: Cardiology | Admitting: Cardiology

## 2022-12-16 VITALS — BP 118/90 | HR 74 | Ht 70.0 in | Wt 205.0 lb

## 2022-12-16 DIAGNOSIS — R55 Syncope and collapse: Secondary | ICD-10-CM

## 2022-12-16 DIAGNOSIS — E785 Hyperlipidemia, unspecified: Secondary | ICD-10-CM

## 2022-12-16 DIAGNOSIS — I10 Essential (primary) hypertension: Secondary | ICD-10-CM | POA: Diagnosis not present

## 2022-12-16 MED ORDER — ATORVASTATIN CALCIUM 20 MG PO TABS: 20.0000 mg | ORAL_TABLET | Freq: Every day | ORAL | 3 refills | Status: AC

## 2022-12-16 NOTE — Patient Instructions (Signed)
Medication Instructions:   INCREASE: Lipitor to 20mg  daily- You may double your current dose and your next refill will reflect your new dose.   Lab Work: Your physician recommends that you return for lab work in: 6 weeks You need to have labs done when you are fasting.  You can come Monday through Friday 8:30 am to 12:00 pm and 1:15 to 4:30. You do not need to make an appointment as the order has already been placed. The labs you are going to have done are AST, ALT,  Lipids.    Testing/Procedures: None Ordered   Follow-Up: At [facility], you and your health needs are our priority.  As part of our continuing mission to provide you with exceptional heart care, we have created designated Provider Care Teams.  These Care Teams include your primary Cardiologist (physician) and Advanced Practice Providers (APPs -  Physician Assistants and Nurse Practitioners) who all work together to provide you with the care you need, when you need it.  We recommend signing up for the patient portal called "MyChart".  Sign up information is provided on this After Visit Summary.  MyChart is used to connect with patients for Virtual Visits (Telemedicine).  Patients are able to view lab/test results, encounter notes, upcoming appointments, etc.  Non-urgent messages can be sent to your provider as well.   To learn more about what you can do with MyChart, go to [email or web address].    Your next appointment:   12 month(s)  The format for your next appointment:   In Person  Provider:   Jenne Campus, MD    Other Instructions NA

## 2022-12-16 NOTE — Addendum Note (Signed)
Addended by: Jacobo Forest D on: 12/16/2022 04:42 PM   Modules accepted: Orders

## 2022-12-16 NOTE — Progress Notes (Signed)
Cardiology Office Note:    Date:  12/16/2022   ID:  Miguel Marks, DOB 17-Jan-1971, MRN [id number]  PCP:  Kristen Loader, FNP  Cardiologist:  Jenne Campus, MD    Referring MD: Kristen Loader, FNP   Chief Complaint  Patient presents with   Follow-up  Doing fine  History of Present Illness:    Miguel Marks is a 52 y.o. male past medical history significant for syncope after that he had extensive evaluation performed which included echocardiogram showing normal ejection fraction, heart MRI did not show any pathology, there were no RV dysplasia there was no infiltrative disease, he also got a stress test which was negative we are looking for catecholamine induced ventricular tachycardia which did not have any additional problem include essential hypertension, dyslipidemia.  He comes today to months for follow-up overall doing quite well denies have any chest pain tightness squeezing pressure burning chest he admits that he did not start routine exercises.  He was find out to have up to 39% stenosis bilateral carotic arteries.  Past Medical History:  Diagnosis Date   HTN (hypertension)     Past Surgical History:  Procedure Laterality Date   NO PAST SURGERIES      Current Medications: Current Meds  Medication Sig   amLODipine (NORVASC) 10 MG tablet Take 10 mg by mouth daily.   aspirin EC 81 MG tablet Take 81 mg by mouth daily.   atorvastatin (LIPITOR) 10 MG tablet    Multiple Vitamins-Minerals (ONE DAILY MENS 50+ MULTIVIT) TABS 1 tablet Orally once daily     Allergies:   Shellfish allergy   Social History   Socioeconomic History   Marital status: Married    Spouse name: Not on file   Number of children: Not on file   Years of education: Not on file   Highest education level: Not on file  Occupational History   Not on file  Tobacco Use   Smoking status: Never    Passive exposure: Never   Smokeless tobacco: Never  Vaping Use   Vaping Use: Never used  Substance  and Sexual Activity   Alcohol use: Not Currently   Drug use: Never   Sexual activity: Not on file  Other Topics Concern   Not on file  Social History Narrative   Not on file   Social Determinants of Health   Financial Resource Strain: Not on file  Food Insecurity: Not on file  Transportation Needs: Not on file  Physical Activity: Not on file  Stress: Not on file  Social Connections: Not on file     Family History: The patient's family history includes Diabetes in his paternal grandmother; Healthy in his brother and sister; Heart failure in his father; Hypertension in his mother and sister. ROS:   Please see the history of present illness.    All 14 point review of systems negative except as described per history of present illness  EKGs/Labs/Other Studies Reviewed:      Recent Labs: No results found for requested labs within last 365 days.  Recent Lipid Panel    Component Value Date/Time   CHOL 157 03/25/2022 1641   TRIG 59 03/25/2022 1641   HDL 46 03/25/2022 1641   CHOLHDL 3.4 03/25/2022 1641   LDLCALC 99 03/25/2022 1641    Physical Exam:    VS:  BP (!) 118/90 (BP Location: Left Arm, Patient Position: Sitting, Cuff Size: Normal)   Pulse 74   Ht 5\' 10"  (1.778  m)   Wt 205 lb (93 kg)   SpO2 98%   BMI 29.41 kg/m     Wt Readings from Last 3 Encounters:  12/16/22 205 lb (93 kg)  11/27/22 206 lb (93.4 kg)  03/25/22 214 lb 6.4 oz (97.3 kg)     GEN:  Well nourished, well developed in no acute distress HEENT: Normal NECK: No JVD; No carotid bruits LYMPHATICS: No lymphadenopathy CARDIAC: RRR, no murmurs, no rubs, no gallops RESPIRATORY:  Clear to auscultation without rales, wheezing or rhonchi  ABDOMEN: Soft, non-tender, non-distended MUSCULOSKELETAL:  No edema; No deformity  SKIN: Warm and dry LOWER EXTREMITIES: no swelling NEUROLOGIC:  Alert and oriented x 3 PSYCHIATRIC:  Normal affect   ASSESSMENT:    1. Primary hypertension   2. Dyslipidemia   3.  Syncope, unspecified syncope type    PLAN:    In order of problems listed above:  Syncope: Denies having any.  Will continue monitoring. Workup negative. Dyslipidemia I did review his K PN which show me his LDL of 106 HDL 48 this is from September 29, 2022, I suggested to double dose of Lipitor to 20 mg daily, fasting lipid profile, AST LT 6 weeks. Carotic arterial disease that being followed by vascular surgeon from [place].  After 39% bilateral stenosis, he is on antiplatelet therapy in form of aspirin 81 mg daily as well as statin which I will continue. Essential hypertension blood pressure well-controlled continue present management   Medication Adjustments/Labs and Tests Ordered: Current medicines are reviewed at length with the patient today.  Concerns regarding medicines are outlined above.  No orders of the defined types were placed in this encounter.  Medication changes: No orders of the defined types were placed in this encounter.   Signed, Park Liter, MD, [facility] 12/16/2022 4:33 PM    [facility]

## 2023-01-13 ENCOUNTER — Ambulatory Visit: Payer: 59 | Admitting: Cardiology

## 2023-01-26 NOTE — Addendum Note (Signed)
Addended by: Baldo Ash D on: 01/26/2023 08:08 AM   Modules accepted: Orders

## 2023-01-27 LAB — LIPID PANEL
Chol/HDL Ratio: 2.2 ratio (ref 0.0–5.0)
Cholesterol, Total: 102 mg/dL (ref 100–199)
HDL: 47 mg/dL (ref >39)
LDL Chol Calc (NIH): 44 mg/dL (ref 0–99)
Triglycerides: 41 mg/dL (ref 0–149)
VLDL Cholesterol Cal: 11 mg/dL (ref 5–40)

## 2023-01-27 LAB — ALT: ALT: 26 IU/L (ref 0–44)

## 2023-01-27 LAB — AST: AST: 22 IU/L (ref 0–40)

## 2023-01-29 ENCOUNTER — Telehealth: Payer: Self-pay

## 2023-01-29 NOTE — Telephone Encounter (Signed)
Patient notified through my chart.

## 2023-01-29 NOTE — Telephone Encounter (Signed)
-----   Message from Georgeanna Lea, MD sent at 01/28/2023  4:56 PM EDT ----- Cholesterol perfect, present management

## 2023-03-16 IMAGING — DX DG CHEST 1V PORT
1 series · 1 of 1 positions shown · non-contrast
Comparison: None.

CLINICAL DATA: Syncope

EXAM:
PORTABLE CHEST 1 VIEW

[chest ap]
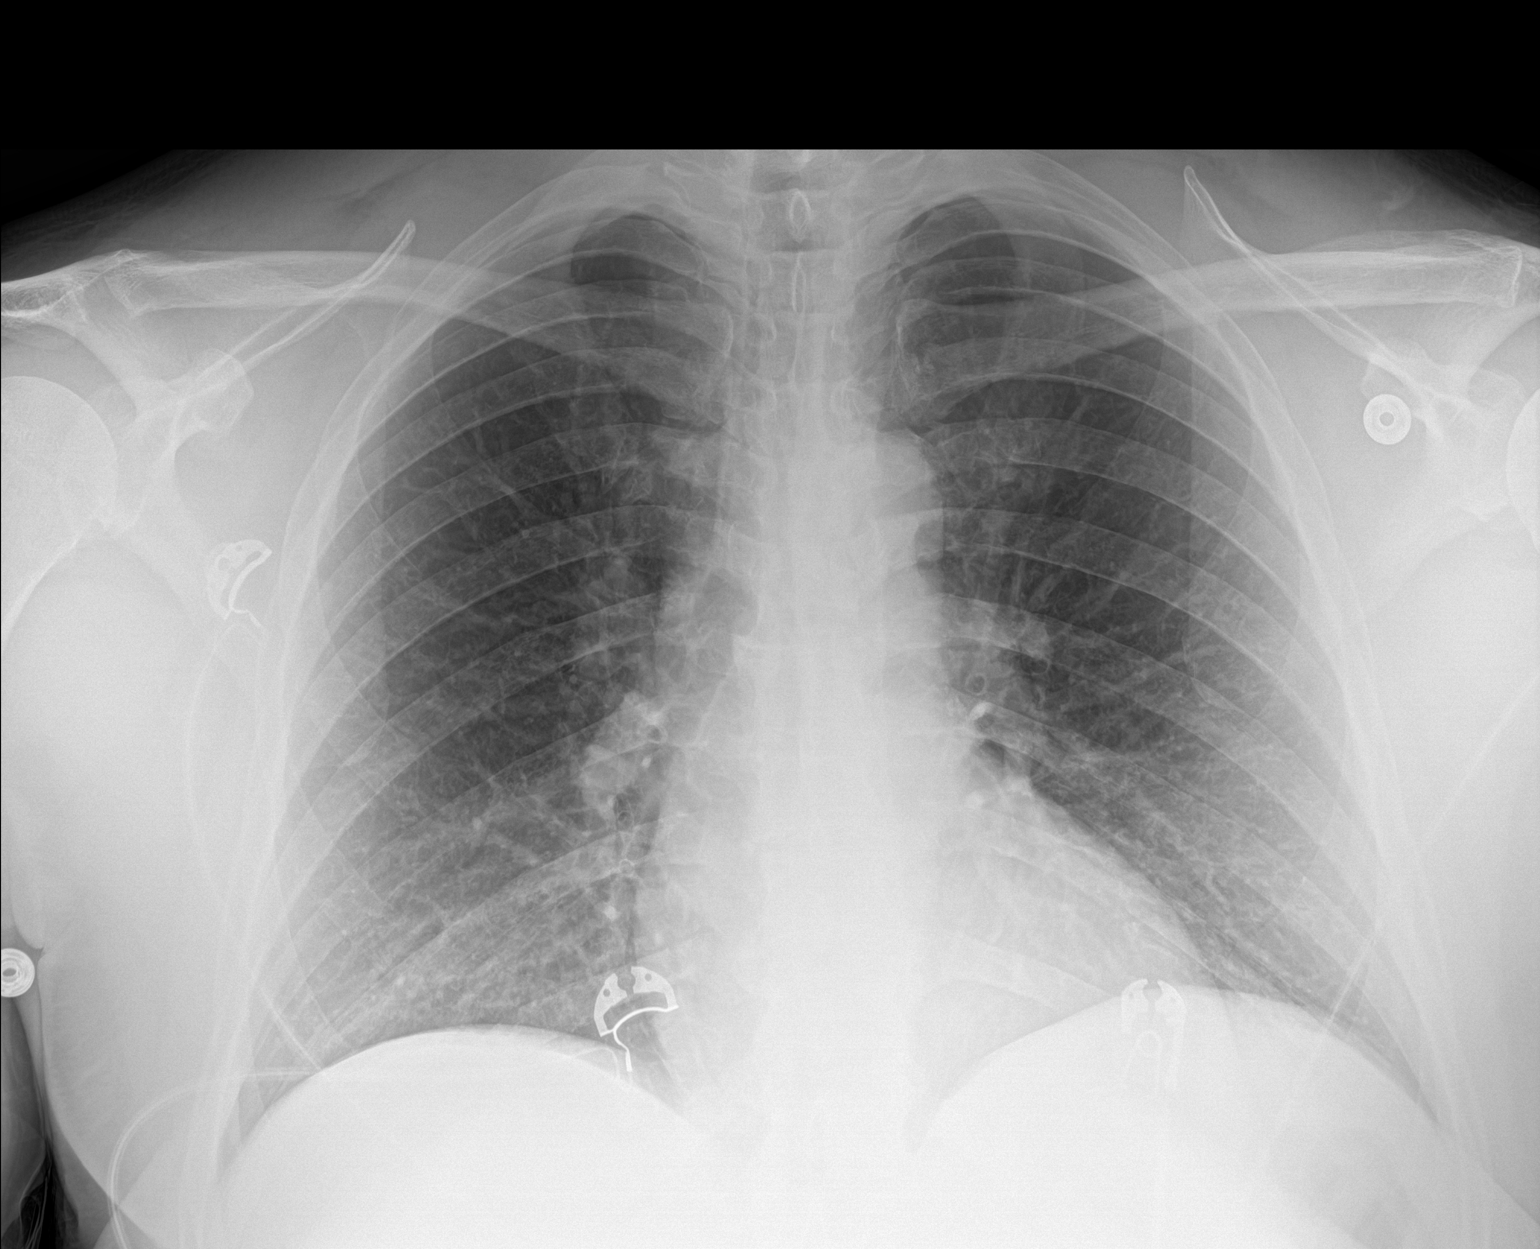

[1 of 1 positions shown; findings below may reference images not displayed]

FINDINGS: The heart size and mediastinal contours are within normal limits.
Both lungs are clear. The visualized skeletal structures are
unremarkable.
IMPRESSION: No active disease.

## 2023-03-19 IMAGING — DX DG CHEST 1V
1 series · 1 of 1 positions shown · non-contrast
Comparison: Chest x-ray 10/02/2021.

CLINICAL DATA: 50-year-old male with history of dizziness. Single
scratch at recent syncopal episode.

EXAM:
CHEST  1 VIEW

[chest ap]
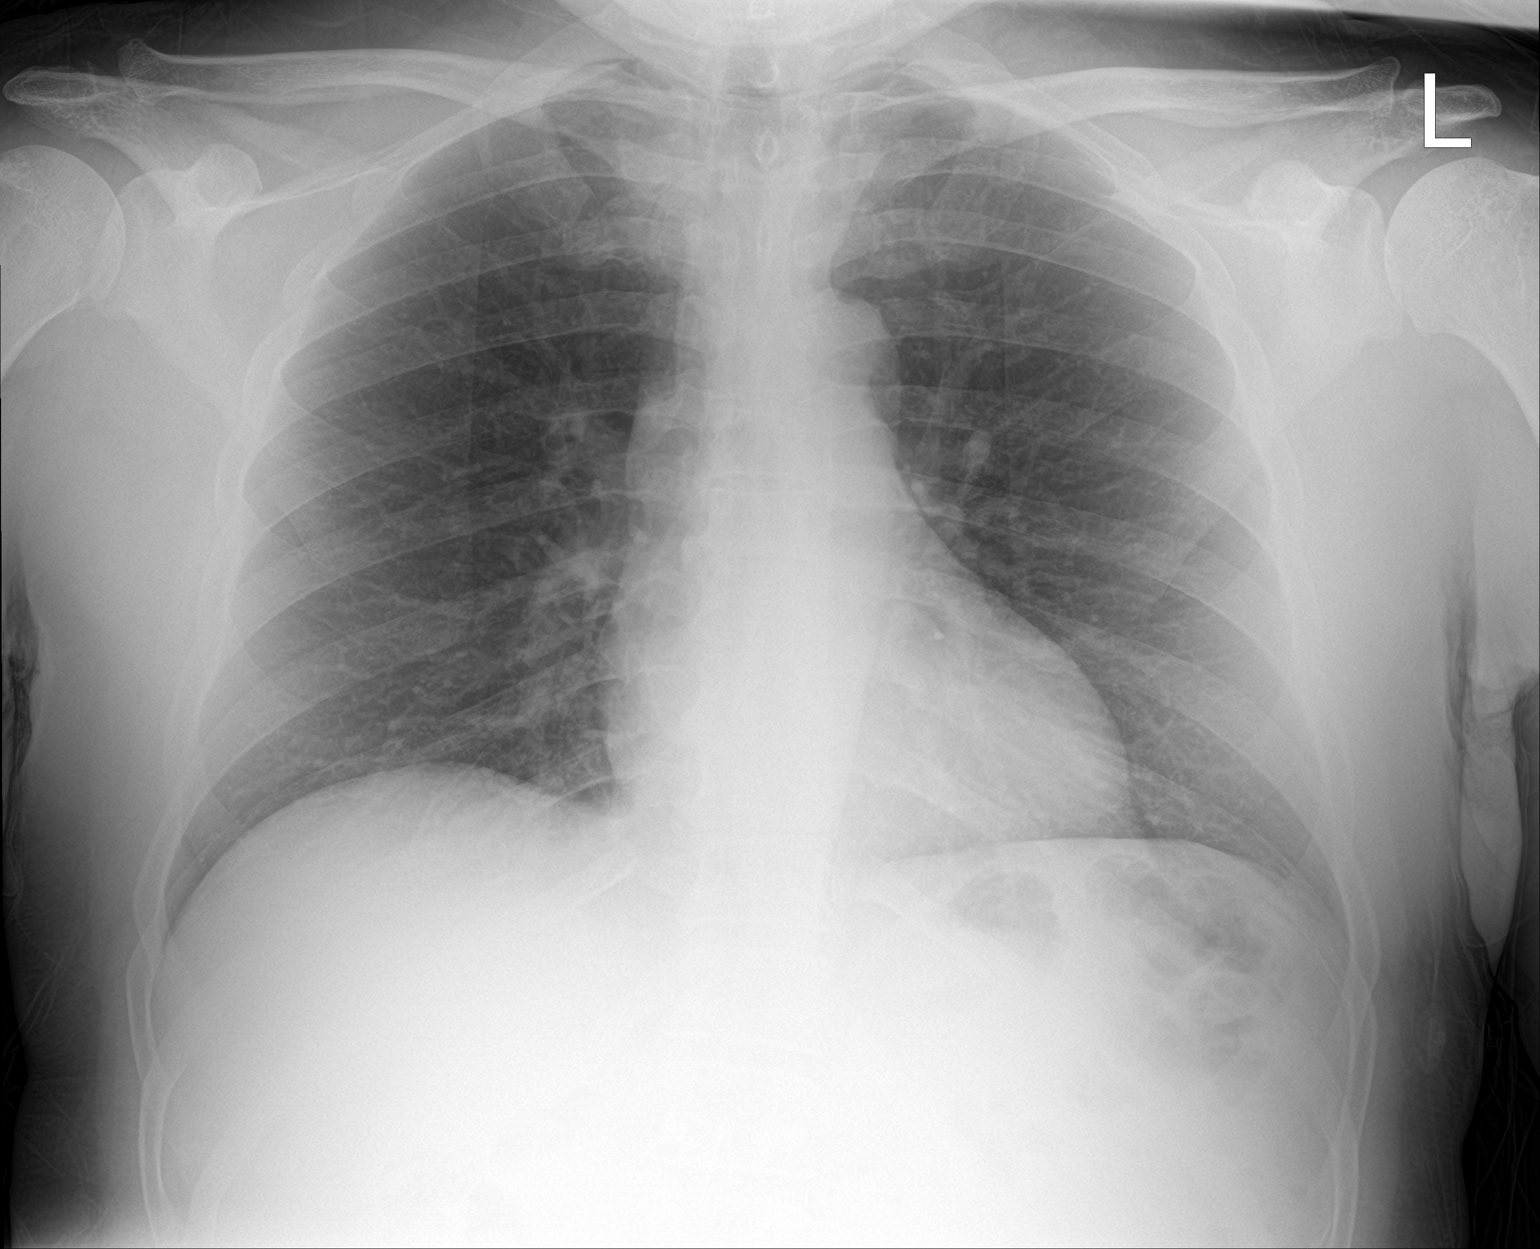

[1 of 1 positions shown; findings below may reference images not displayed]

FINDINGS: Lung volumes are normal. No consolidative airspace disease. No
pleural effusions. No pneumothorax. No pulmonary nodule or mass
noted. Pulmonary vasculature and the cardiomediastinal silhouette
are within normal limits.
IMPRESSION: No radiographic evidence of acute cardiopulmonary disease.

## 2023-11-29 ENCOUNTER — Other Ambulatory Visit: Payer: Self-pay | Admitting: *Deleted

## 2023-11-29 DIAGNOSIS — I6523 Occlusion and stenosis of bilateral carotid arteries: Secondary | ICD-10-CM

## 2023-12-01 NOTE — Progress Notes (Unsigned)
 Office Note     CC:  bilateral asymptomatic carotid disease Requesting Provider:  Soundra Pilon, FNP  HPI: Miguel Marks is a 53 y.o. ([birth year]) male presenting at the request of .Soundra Pilon, FNP for evaluation of bilateral asymptomatic carotid disease.  Originally from [facility] (near [place]), he moved to [place] roughly 23 years ago.  He and his wife have been married for 20 years, and have 2 children -son in college at [employer], their daughter at [employer] high school.  Works as an Art gallery manager for [employer].  His daughter plans to become a Advice worker.  Jazir underwent chest x-ray due to some left arm intermittent tingling and numbness.  This was followed by bilateral carotid ultrasound due to some haziness appreciated on the x-ray in 2024.  Duplex ultrasound demonstrated mild disease bilaterally.  On exam today, Miguel Marks was doing well.  He presents for 1 year follow-up.  He has had no changes in his health over the last year.  He denies history of stroke, TIA, amaurosis.  Medications include statin, aspirin.  Nonsmoker.     Past Medical History:  Diagnosis Date   HTN (hypertension)     Past Surgical History:  Procedure Laterality Date   NO PAST SURGERIES      Social History   Socioeconomic History   Marital status: Married    Spouse name: Not on file   Number of children: Not on file   Years of education: Not on file   Highest education level: Not on file  Occupational History   Not on file  Tobacco Use   Smoking status: Never    Passive exposure: Never   Smokeless tobacco: Never  Vaping Use   Vaping status: Never Used  Substance and Sexual Activity   Alcohol use: Not Currently   Drug use: Never   Sexual activity: Not on file  Other Topics Concern   Not on file  Social History Narrative   Not on file   Social Drivers of Health   Financial Resource Strain: Not on File (06/22/2022)   Received from [employer]    Financial Resource Strain: 0  Food Insecurity: Not on File (06/22/2022)   Received from [facility] Insecurity    Food: 0  Transportation Needs: Not on File (06/22/2022)   Received from [facility] Needs    Transportation: 0  Physical Activity: Not on File (07/28/2022)   Received from [place], [place]   Physical Activity    Physical Activity: 0  Stress: Not on File (07/28/2022)   Received from [facility], [place]   Stress    Stress: 0  Social Connections: Not on File (05/31/2023)   Received from [facility]   Social Connections    Connectedness: 0  Intimate Partner Violence: Not on file   Family History  Problem Relation Age of Onset   Hypertension Mother    Heart failure Father    Hypertension Sister    Healthy Sister    Healthy Brother    Diabetes Paternal Grandmother     Current Outpatient Medications  Medication Sig Dispense Refill   amLODipine (NORVASC) 10 MG tablet Take 10 mg by mouth daily.     aspirin EC 81 MG tablet Take 81 mg by mouth daily.     atorvastatin (LIPITOR) 20 MG tablet Take 1 tablet (20 mg total) by mouth daily. 90 tablet 3   Multiple Vitamins-Minerals (ONE DAILY MENS 50+  MULTIVIT) TABS 1 tablet Orally once daily     No current facility-administered medications for this visit.    Allergies  Allergen Reactions   Shellfish Allergy Anaphylaxis     REVIEW OF SYSTEMS:  [X]  denotes positive finding, [ ]  denotes negative finding Cardiac  Comments:  Chest pain or chest pressure:    Shortness of breath upon exertion:    Short of breath when lying flat:    Irregular heart rhythm:        Vascular    Pain in calf, thigh, or hip brought on by ambulation:    Pain in feet at night that wakes you up from your sleep:     Blood clot in your veins:    Leg swelling:         Pulmonary    Oxygen at home:    Productive cough:     Wheezing:         Neurologic    Sudden weakness in arms or legs:     Sudden numbness in arms or legs:      Sudden onset of difficulty speaking or slurred speech:    Temporary loss of vision in one eye:     Problems with dizziness:         Gastrointestinal    Blood in stool:     Vomited blood:         Genitourinary    Burning when urinating:     Blood in urine:        Psychiatric    Major depression:         Hematologic    Bleeding problems:    Problems with blood clotting too easily:        Skin    Rashes or ulcers:        Constitutional    Fever or chills:      PHYSICAL EXAMINATION:  There were no vitals filed for this visit.  General:  WDWN in NAD; vital signs documented above Gait: Not observed HENT: WNL, normocephalic Pulmonary: normal non-labored breathing , without wheezing Cardiac: regular HR Abdomen: soft, NT, no masses Skin: without rashes Vascular Exam/Pulses:  Right Left  Radial 2+ (normal) 2+ (normal)  Ulnar    Femoral    Popliteal    DP    PT     Extremities: without ischemic changes, without Gangrene , without cellulitis; without open wounds;  Musculoskeletal: no muscle wasting or atrophy  Neurologic: A&O X 3;  No focal weakness or paresthesias are detected Psychiatric:  The pt has Normal affect.     Right Carotid Findings:  +----------+--------+--------+--------+------------------+--------+           PSV cm/sEDV cm/sStenosisPlaque DescriptionComments  +----------+--------+--------+--------+------------------+--------+  CCA Prox  115     34                                          +----------+--------+--------+--------+------------------+--------+  CCA Mid   105     29                                          +----------+--------+--------+--------+------------------+--------+  CCA Distal95      25                                          +----------+--------+--------+--------+------------------+--------+  ICA Prox  86      17      1-39%   heterogenous                 +----------+--------+--------+--------+------------------+--------+  ICA Mid   78      34                                          +----------+--------+--------+--------+------------------+--------+  ICA Distal82      34                                          +----------+--------+--------+--------+------------------+--------+  ECA      118     31                                          +----------+--------+--------+--------+------------------+--------+   +----------+--------+-------+----------------+-------------------+           PSV cm/sEDV cmsDescribe        Arm Pressure (mmHG)  +----------+--------+-------+----------------+-------------------+  Subclavian216           Multiphasic, WNL                     +----------+--------+-------+----------------+-------------------+   +---------+--------+--+--------+--+---------+  VertebralPSV cm/s27EDV cm/s10Antegrade  +---------+--------+--+--------+--+---------+      Left Carotid Findings:  +----------+--------+--------+--------+------------------+--------+           PSV cm/sEDV cm/sStenosisPlaque DescriptionComments  +----------+--------+--------+--------+------------------+--------+  CCA Prox  168     50                                          +----------+--------+--------+--------+------------------+--------+  CCA Mid   93      28                                          +----------+--------+--------+--------+------------------+--------+  CCA Distal108     35              homogeneous                 +----------+--------+--------+--------+------------------+--------+  ICA Prox  60      12      1-39%   homogeneous                 +----------+--------+--------+--------+------------------+--------+  ICA Mid   61      31                                          +----------+--------+--------+--------+------------------+--------+  ICA Distal58      30                                           +----------+--------+--------+--------+------------------+--------+  ECA      96      28                                          +----------+--------+--------+--------+------------------+--------+   +----------+--------+--------+----------------+-------------------+  PSV cm/sEDV cm/sDescribe        Arm Pressure (mmHG)  +----------+--------+--------+----------------+-------------------+  Subclavian210            Multiphasic, WNL                     +----------+--------+--------+----------------+-------------------+   +---------+--------+--+--------+--+---------+  VertebralPSV cm/s20EDV cm/s10Antegrade      ASSESSMENT/PLAN: ERMIN PARISIEN is a 53 y.o. male presenting with mild, asymptomatic, bilateral carotid artery stenosis.  I had a long discussion with Som[place]ang regarding the above.  He has had no changes over the last year and continues to have very mild stenosis, if any.  At this point, I feel comfortable following him up in 5 years.  Similar to his last visit, we discussed the signs and symptoms of stroke, TIA, amaurosis.  I asked him to call 911 should any these occur.  I think that with his level of stenosis should any of these occur, this would be of cardiac origin, not from carotid disease.  Kaiven was comfortable with this plan moving forward.   Victorino Sparrow, MD [facility] [phone]

## 2023-12-02 ENCOUNTER — Ambulatory Visit: Payer: 59 | Admitting: Vascular Surgery

## 2023-12-02 ENCOUNTER — Encounter: Payer: Self-pay | Admitting: Vascular Surgery

## 2023-12-02 ENCOUNTER — Ambulatory Visit (HOSPITAL_COMMUNITY)
Admission: RE | Admit: 2023-12-02 | Discharge: 2023-12-02 | Disposition: A | Discharge status: Home / Self Care | Payer: 59 | Source: Ambulatory Visit | Attending: Vascular Surgery | Admitting: Vascular Surgery

## 2023-12-02 VITALS — BP 137/88 | HR 73 | Temp 97.9°F | Resp 18 | Ht 70.0 in | Wt 205.5 lb

## 2023-12-02 DIAGNOSIS — I6523 Occlusion and stenosis of bilateral carotid arteries: Secondary | ICD-10-CM | POA: Insufficient documentation

## 2023-12-14 ENCOUNTER — Encounter: Payer: Self-pay | Admitting: Cardiology

## 2023-12-14 ENCOUNTER — Ambulatory Visit: Payer: 59 | Attending: Cardiology | Admitting: Cardiology

## 2023-12-14 VITALS — BP 120/82 | HR 78 | Ht 69.0 in | Wt 207.0 lb

## 2023-12-14 DIAGNOSIS — R55 Syncope and collapse: Secondary | ICD-10-CM | POA: Diagnosis not present

## 2023-12-14 DIAGNOSIS — E785 Hyperlipidemia, unspecified: Secondary | ICD-10-CM

## 2023-12-14 DIAGNOSIS — I739 Peripheral vascular disease, unspecified: Secondary | ICD-10-CM | POA: Diagnosis not present

## 2023-12-14 DIAGNOSIS — I10 Essential (primary) hypertension: Secondary | ICD-10-CM | POA: Diagnosis not present

## 2023-12-14 NOTE — Patient Instructions (Signed)

## 2023-12-14 NOTE — Progress Notes (Signed)
 Cardiology Office Note:    Date:  12/14/2023   ID:  Miguel Marks, DOB 12-May-1971, MRN [id number]  PCP:  Soundra Pilon, FNP  Cardiologist:  Gypsy Balsam, MD    Referring MD: Soundra Pilon, FNP   Chief Complaint  Patient presents with   Follow-up    History of Present Illness:    Miguel Marks is a 53 y.o. male with past medical history significant for syncope after which she had quite extensive ablation done that included echocardiogram showing preserved ejection fraction, heart MRI which was normal, there was no RV dysplasia, there were no infiltrative disease, stress test being done which was negative at that time were looking for catecholamine induced ventricular tachycardia again that was negative, also essential hypertension, dyslipidemia. Comes today to months for follow-up overall doing great.  Denies have any chest pain tightness squeezing pressure burning chest he does what he wants to do with no difficulties.  He used to exercise on the regular basis but admitted that he slight cough during the winter but he is ready to go back to exercise  Past Medical History:  Diagnosis Date   Carotid artery occlusion    HTN (hypertension)     Past Surgical History:  Procedure Laterality Date   NO PAST SURGERIES      Current Medications: Current Meds  Medication Sig   amLODipine (NORVASC) 10 MG tablet Take 10 mg by mouth daily.   aspirin EC 81 MG tablet Take 81 mg by mouth daily.   atorvastatin (LIPITOR) 20 MG tablet Take 1 tablet (20 mg total) by mouth daily.   Multiple Vitamins-Minerals (ONE DAILY MENS 50+ MULTIVIT) TABS Take 1 tablet by mouth daily.     Allergies:   Shellfish allergy   Social History   Socioeconomic History   Marital status: Married    Spouse name: Not on file   Number of children: Not on file   Years of education: Not on file   Highest education level: Not on file  Occupational History   Not on file  Tobacco Use   Smoking status: Never     Passive exposure: Never   Smokeless tobacco: Never  Vaping Use   Vaping status: Never Used  Substance and Sexual Activity   Alcohol use: Not Currently   Drug use: Never   Sexual activity: Not on file  Other Topics Concern   Not on file  Social History Narrative   Not on file   Social Drivers of Health   Financial Resource Strain: Not on File (06/22/2022)   Received from [facility]    Financial Resource Strain: 0  Food Insecurity: Not on File (06/22/2022)   Received from [facility] Insecurity    Food: 0  Transportation Needs: Not on File (06/22/2022)   Received from [facility] Needs    Transportation: 0  Physical Activity: Not on File (07/28/2022)   Received from [place], [place]   Physical Activity    Physical Activity: 0  Stress: Not on File (07/28/2022)   Received from [facility], [place]   Stress    Stress: 0  Social Connections: Not on File (05/31/2023)   Received from [facility]   Social Connections    Connectedness: 0     Family History: The patient's family history includes Diabetes in his paternal grandmother; Healthy in his brother and sister; Heart failure in his father; Hypertension in his mother and sister. ROS:   Please  see the history of present illness.    All 14 point review of systems negative except as described per history of present illness  EKGs/Labs/Other Studies Reviewed:         Recent Labs: 01/26/2023: ALT 26  Recent Lipid Panel    Component Value Date/Time   CHOL 102 01/26/2023 0809   TRIG 41 01/26/2023 0809   HDL 47 01/26/2023 0809   CHOLHDL 2.2 01/26/2023 0809   LDLCALC 44 01/26/2023 0809    Physical Exam:    VS:  BP 120/82 (BP Location: Right Arm, Patient Position: Sitting)   Pulse 78   Ht 5\' 9"  (1.753 m)   Wt 207 lb (93.9 kg)   SpO2 96%   BMI 30.57 kg/m     Wt Readings from Last 3 Encounters:  12/14/23 207 lb (93.9 kg)  12/02/23 205 lb 8 oz (93.2 kg)  12/16/22 205 lb (93 kg)     GEN:  Well  nourished, well developed in no acute distress HEENT: Normal NECK: No JVD; No carotid bruits LYMPHATICS: No lymphadenopathy CARDIAC: RRR, no murmurs, no rubs, no gallops RESPIRATORY:  Clear to auscultation without rales, wheezing or rhonchi  ABDOMEN: Soft, non-tender, non-distended MUSCULOSKELETAL:  No edema; No deformity  SKIN: Warm and dry LOWER EXTREMITIES: no swelling NEUROLOGIC:  Alert and oriented x 3 PSYCHIATRIC:  Normal affect   ASSESSMENT:    1. Primary hypertension   2. Syncope, unspecified syncope type   3. Dyslipidemia   4. Peripheral vascular disease, unspecified (HCC) mild carotic arterial disease    PLAN:    In order of problems listed above:  Essential hypertension blood pressure well-controlled continue present management. History of syncope no more passing out doing well continue monitoring extensive workup negative. Dyslipidemia I did review K PN which show me his LDL 56 HDL 47 this is from October 05, 2023 good control continue with statin in form of Lipitor 20. Peripheral vascular disease, followed by group from [place].  Stable he was told that he did not require cardiac ultrasounds in about 5 years.  I would probably lower this to maybe 3 years or so.  He does not have any TIA/CVA-like symptoms.  Continue monitoring   Medication Adjustments/Labs and Tests Ordered: Current medicines are reviewed at length with the patient today.  Concerns regarding medicines are outlined above.  Orders Placed This Encounter  Procedures   EKG 12-Lead   Medication changes: No orders of the defined types were placed in this encounter.   Signed, Georgeanna Lea, MD, [facility] 12/14/2023 8:42 AM    [facility]

## 2024-12-12 ENCOUNTER — Ambulatory Visit: Admitting: Cardiology
